# Patient Record
Sex: Female | Born: 1990 | Race: White | Hispanic: No | Marital: Married | Smoking: Former smoker
Health system: Southern US, Community
[De-identification: ages and names within clinical notes are randomized; demographics above are authoritative.]

## PROBLEM LIST (undated history)

## (undated) ENCOUNTER — Inpatient Hospital Stay (HOSPITAL_COMMUNITY): Payer: Self-pay

## (undated) DIAGNOSIS — Z3490 Encounter for supervision of normal pregnancy, unspecified, unspecified trimester: Secondary | ICD-10-CM

## (undated) DIAGNOSIS — K219 Gastro-esophageal reflux disease without esophagitis: Secondary | ICD-10-CM

## (undated) DIAGNOSIS — F909 Attention-deficit hyperactivity disorder, unspecified type: Secondary | ICD-10-CM

## (undated) DIAGNOSIS — M419 Scoliosis, unspecified: Secondary | ICD-10-CM

## (undated) HISTORY — DX: Attention-deficit hyperactivity disorder, unspecified type: F90.9

## (undated) HISTORY — DX: Scoliosis, unspecified: M41.9

## (undated) HISTORY — DX: Gastro-esophageal reflux disease without esophagitis: K21.9

## (undated) HISTORY — PX: MULTIPLE TOOTH EXTRACTIONS: SHX2053

---
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
## 2012-09-09 ENCOUNTER — Emergency Department (HOSPITAL_BASED_OUTPATIENT_CLINIC_OR_DEPARTMENT_OTHER)
Admission: EM | Admit: 2012-09-09 | Discharge: 2012-09-09 | Disposition: A | Discharge status: Home / Self Care | Payer: Medicaid - Out of State | Attending: Emergency Medicine | Admitting: Emergency Medicine

## 2012-09-09 ENCOUNTER — Encounter (HOSPITAL_BASED_OUTPATIENT_CLINIC_OR_DEPARTMENT_OTHER): Payer: Self-pay | Admitting: *Deleted

## 2012-09-09 DIAGNOSIS — R0982 Postnasal drip: Secondary | ICD-10-CM | POA: Insufficient documentation

## 2012-09-09 DIAGNOSIS — R05 Cough: Secondary | ICD-10-CM | POA: Insufficient documentation

## 2012-09-09 DIAGNOSIS — J069 Acute upper respiratory infection, unspecified: Secondary | ICD-10-CM | POA: Insufficient documentation

## 2012-09-09 DIAGNOSIS — R51 Headache: Secondary | ICD-10-CM | POA: Insufficient documentation

## 2012-09-09 DIAGNOSIS — R059 Cough, unspecified: Secondary | ICD-10-CM | POA: Insufficient documentation

## 2012-09-09 DIAGNOSIS — J3489 Other specified disorders of nose and nasal sinuses: Secondary | ICD-10-CM | POA: Insufficient documentation

## 2012-09-09 DIAGNOSIS — B9789 Other viral agents as the cause of diseases classified elsewhere: Secondary | ICD-10-CM | POA: Insufficient documentation

## 2012-09-09 DIAGNOSIS — B349 Viral infection, unspecified: Secondary | ICD-10-CM

## 2012-09-09 DIAGNOSIS — R52 Pain, unspecified: Secondary | ICD-10-CM | POA: Insufficient documentation

## 2012-09-09 NOTE — ED Notes (Signed)
Cough, sneezing, sore throat, headache and body aches. She is [redacted] weeks pregnant.

## 2012-09-09 NOTE — ED Provider Notes (Signed)
History     CSN: [id number]  Arrival date & time 09/09/12  1245   First MD Initiated Contact with Patient 09/09/12 1306      Chief Complaint  Patient presents with  . URI    (Consider location/radiation/quality/duration/timing/severity/associated sxs/prior treatment) HPI Comments: Patient who is currently [redacted] weeks pregnant presents today with a chief complaint sore throat, rhinorrhea, post nasal drip, nasal congestion, rhinorrhea, headache, dry cough, and body aches.  Her symptoms have been present since yesterday and are gradually worsening.  She has not taken anything for her symptoms.  She denies fever, chills, chest pain, neck pain/stiffness, or shortness of breath.  She also denies vaginal bleeding, vaginal discharge, pelvic pain, or abdominal pain.  She has also been nauseous, but denies vomiting.  No prior history of Asthma.    The history is provided by the patient.    History reviewed. No pertinent past medical history.  History reviewed. No pertinent past surgical history.  No family history on file.  History  Substance Use Topics  . Smoking status: Never Smoker   . Smokeless tobacco: Not on file  . Alcohol Use: No    OB History   Grav Para Term Preterm Abortions TAB SAB Ect Mult Living                  Review of Systems  Constitutional: Negative for fever and chills.  HENT: Positive for sore throat.   Gastrointestinal: Positive for nausea. Negative for vomiting and abdominal pain.  Genitourinary: Negative for vaginal bleeding, vaginal discharge and pelvic pain.    Allergies  Review of patient's allergies indicates no known allergies.  Home Medications  No current outpatient prescriptions on file.  BP 95/62  Pulse 101  Temp(Src) 98.7 F (37.1 C) (Oral)  Resp 20  Wt 145 lb (65.772 kg)  SpO2 98%  LMP 07/30/2012  Physical Exam  Nursing note and vitals reviewed. Constitutional: She appears well-developed and well-nourished. No distress.  HENT:   Head: Normocephalic and atraumatic.  Right Ear: Tympanic membrane and ear canal normal.  Left Ear: Tympanic membrane and ear canal normal.  Nose: Mucosal edema and rhinorrhea present. Right sinus exhibits no maxillary sinus tenderness and no frontal sinus tenderness. Left sinus exhibits no maxillary sinus tenderness and no frontal sinus tenderness.  Mouth/Throat: Uvula is midline, oropharynx is clear and moist and mucous membranes are normal. No oropharyngeal exudate or posterior oropharyngeal edema.  Eyes: EOM are normal. Pupils are equal, round, and reactive to light.  Neck: Normal range of motion. Neck supple.  Cardiovascular: Normal rate, regular rhythm and normal heart sounds.   Pulmonary/Chest: Effort normal and breath sounds normal.  Abdominal: Soft. There is no tenderness.  Neurological: She is alert.  Skin: Skin is warm and dry. She is not diaphoretic.  Psychiatric: She has a normal mood and affect.    ED Course  Procedures (including critical care time)  Labs Reviewed - No data to display No results found.   No diagnosis found.    MDM  Symptoms consistent with viral illness.  Patient is afebrile.  Vital signs stable.  Patient reported that she is currently [redacted] weeks pregnant.  Patient denies any abdominal or pelvic pain.  No vaginal discharge or bleeding.  Patient stable for discharge.  Instructed patient to take Tylenol for symptoms.  Return precautions given.        Pascal Lux [place], PA-C 09/09/12 [phone]

## 2012-09-09 NOTE — ED Notes (Signed)
Pt discharged with family member.  Verbalize understanding to follow up with OBGYN

## 2012-09-14 NOTE — ED Provider Notes (Signed)
History/physical exam/procedure(s) were performed by non-physician practitioner and as supervising physician I was immediately available for consultation/collaboration. I have reviewed all notes and am in agreement with care and plan.   Hilario Quarry, MD 09/14/12 1201

## 2012-09-17 ENCOUNTER — Emergency Department (HOSPITAL_COMMUNITY): Payer: Medicaid - Out of State

## 2012-09-17 ENCOUNTER — Emergency Department (HOSPITAL_COMMUNITY)
Admission: EM | Admit: 2012-09-17 | Discharge: 2012-09-18 | Disposition: A | Discharge status: Home / Self Care | Payer: Medicaid - Out of State | Attending: Emergency Medicine | Admitting: Emergency Medicine

## 2012-09-17 ENCOUNTER — Encounter (HOSPITAL_COMMUNITY): Payer: Self-pay | Admitting: Emergency Medicine

## 2012-09-17 DIAGNOSIS — R1031 Right lower quadrant pain: Secondary | ICD-10-CM | POA: Insufficient documentation

## 2012-09-17 DIAGNOSIS — O2390 Unspecified genitourinary tract infection in pregnancy, unspecified trimester: Secondary | ICD-10-CM | POA: Insufficient documentation

## 2012-09-17 DIAGNOSIS — N760 Acute vaginitis: Secondary | ICD-10-CM | POA: Insufficient documentation

## 2012-09-17 DIAGNOSIS — B9689 Other specified bacterial agents as the cause of diseases classified elsewhere: Secondary | ICD-10-CM

## 2012-09-17 DIAGNOSIS — R109 Unspecified abdominal pain: Secondary | ICD-10-CM

## 2012-09-17 DIAGNOSIS — O9989 Other specified diseases and conditions complicating pregnancy, childbirth and the puerperium: Secondary | ICD-10-CM | POA: Insufficient documentation

## 2012-09-17 LAB — URINALYSIS, ROUTINE W REFLEX MICROSCOPIC
Bilirubin Urine: NEGATIVE
Glucose, UA: NEGATIVE mg/dL
Hgb urine dipstick: NEGATIVE
Ketones, ur: NEGATIVE mg/dL
Protein, ur: NEGATIVE mg/dL
Urobilinogen, UA: 0.2 mg/dL (ref 0.0–1.0)

## 2012-09-17 LAB — WET PREP, GENITAL: WBC, Wet Prep HPF POC: NONE SEEN

## 2012-09-17 LAB — POCT PREGNANCY, URINE: Preg Test, Ur: POSITIVE — AB

## 2012-09-17 MED ORDER — ACETAMINOPHEN 325 MG PO TABS
650.0000 mg | ORAL_TABLET | Freq: Once | ORAL | Status: AC
  Administered 2012-09-17: 650 mg via ORAL
  Filled 2012-09-17: qty 2

## 2012-09-17 NOTE — ED Notes (Signed)
Pt states she is having cramping all over her body and sharp pain in her right lower quadrant  Pt states she is pregnant at least 4 weeks, unsure when her last period was  Pt states she had a small amt of spotting a few days ago but nothing since  Pt states her sxs started about an hour ago  Pt states she has had a cough

## 2012-09-17 NOTE — ED Provider Notes (Signed)
History     CSN: [id number]  Arrival date & time 09/17/12  2023   First MD Initiated Contact with Patient 09/17/12 2119      Chief Complaint  Patient presents with  . Abdominal Pain    (Consider location/radiation/quality/duration/timing/severity/associated sxs/prior treatment) HPI History provided by pt.   Pt took a positive pregnancy test one month ago.  LMP ~2 months ago.  Has not had any prenatal care.  2 hours prior to arrival she developed severe cramping in right lower abd.  Intermittent, non-radiating, no associated sx.  Had vaginal spotting 1 week ago, but none since.  G1P1; last pregnancy uncomplicated.  No h/o abdominal surgeries.  History reviewed. No pertinent past medical history.  History reviewed. No pertinent past surgical history.  Family History  Problem Relation Age of Onset  . Adopted: Yes    History  Substance Use Topics  . Smoking status: Never Smoker   . Smokeless tobacco: Not on file  . Alcohol Use: No    OB History   Grav Para Term Preterm Abortions TAB SAB Ect Mult Living   1               Review of Systems  All other systems reviewed and are negative.    Allergies  Review of patient's allergies indicates no known allergies.  Home Medications   Current Outpatient Rx  Name  Route  Sig  Dispense  Refill  . guaiFENesin (MUCINEX) 600 MG 12 hr tablet   Oral   Take 1,200 mg by mouth 2 (two) times daily.         Marland Kitchen ibuprofen (ADVIL,MOTRIN) 200 MG tablet   Oral   Take 400 mg by mouth every 6 (six) hours as needed for pain.         . Prenatal Vit-Fe Fumarate-FA (MULTIVITAMIN-PRENATAL) 27-0.8 MG TABS   Oral   Take 2 tablets by mouth daily at 12 noon.           BP 103/51  Pulse 92  Temp(Src) 98.1 F (36.7 C) (Oral)  Resp 18  Ht 5\' 5"  (1.651 m)  Wt 145 lb (65.772 kg)  BMI 24.13 kg/m2  SpO2 100%  LMP 07/30/2012  Physical Exam  Nursing note and vitals reviewed. Constitutional: She is oriented to person, place, and time.  She appears well-developed and well-nourished. No distress.  HENT:  Head: Normocephalic and atraumatic.  Eyes:  Normal appearance  Neck: Normal range of motion.  Cardiovascular: Normal rate and regular rhythm.   Pulmonary/Chest: Effort normal and breath sounds normal. No respiratory distress.  Abdominal: Soft. Bowel sounds are normal. She exhibits no distension and no mass. There is no rebound and no guarding.  Diffuse tenderness but worst in right suprapubic  Genitourinary:  R CVA tenderness. Nml external genitalia.  No vaginal discharge/bleeding.  Cervix closed and appears nml.  No adnexal or cervical motion tenderess.    Musculoskeletal: Normal range of motion.  Neurological: She is alert and oriented to person, place, and time.  Skin: Skin is warm and dry. No rash noted.  Psychiatric: She has a normal mood and affect. Her behavior is normal.    ED Course  Procedures (including critical care time)  Labs Reviewed  WET PREP, GENITAL - Abnormal; Notable for the following:    Clue Cells Wet Prep HPF POC MODERATE (*)    All other components within normal limits  URINALYSIS, ROUTINE W REFLEX MICROSCOPIC - Abnormal; Notable for the following:    APPearance  CLOUDY (*)    Specific Gravity, Urine 1.036 (*)    All other components within normal limits  POCT PREGNANCY, URINE - Abnormal; Notable for the following:    Preg Test, Ur POSITIVE (*)    All other components within normal limits  GC/CHLAMYDIA PROBE AMP   Mr Pelvis Wo Contrast  09/18/2012   *RADIOLOGY REPORT*  Clinical Data:  Right lower quadrant abdominal pain.  Early pregnancy.  MRI ABDOMEN AND PELVIS WITHOUT CONTRAST  Technique:  Multiplanar multisequence MR imaging of the abdomen and pelvis was performed.  No intravenous contrast was administered.  Comparison:  09/17/2012 ultrasound  MRI ABDOMEN  Findings:  On image 64 of series 4, there is a subtle hyperintensity in the hepatic parenchyma of segment 8. Probable central scar in  an underlying lesion based on morphology and appearance.  Visualized liver otherwise unremarkable.  Small subcutaneous lesion along the left posterolateral back on image 51 of series 4 is probably a sebaceous cyst or similar cutaneous lesion.  Noncontrast MR appearance of the spleen, pancreas, adrenal glands, and kidneys is normal.  No upper abdominal ascites.  Gallbladder mildly contracted but otherwise unremarkable.  No upper abdominal adenopathy observed.  IMPRESSION:  1.  Suspected subtle liver lesion in segment eight, possibly focal nodular hyperplasia. Hepatic adenoma is a possibility but seems less likely given the suggestion of a faint central scar.  No evidence of associated blood products.  I doubt that this lesion is currently of clinical significance or contributing to the patient's pain, but this may warrant followup with a dedicated hepatic protocol MRI with and without contrast in the postpartum period.  MRI PELVIS  Findings: The cecum appears to be in the pelvis.  Candidate structure for appendix medial to the cecum does not appear inflamed.  This is shown on images 15-20 of series 5.  There is only a trace amount of free pelvic fluid in the cul-de- sac. Thick-walled cystic lesion of the right ovary, as shown on recent ultrasound, probably a corpus luteum of pregnancy.  The psoas and rectus abdominus musculature unremarkable.  The sacroiliac joints normal.  No large right eccentric disc protrusion observed.  No adenopathy noted in the abdomen or pelvis. Gestational sac somewhat eccentric to the right in the uterus, but not believed to be cornual/interstitial based on apparent intact overlying myometrium.  No dilated bowel observed.  Urinary bladder not distended but unremarkable.  IMPRESSION:  1.  No compelling findings of acute appendicitis.  Candidate structure for appendix medial to the cecum does not appear inflamed.  2.  Right fundal gestational sac noted. Suspected right ovarian corpus luteum  of pregnancy.   Original Report Authenticated By: Gaylyn Rong, M.D.   Mr Abdomen Wo Contrast  09/18/2012   *RADIOLOGY REPORT*  Clinical Data:  Right lower quadrant abdominal pain.  Early pregnancy.  MRI ABDOMEN AND PELVIS WITHOUT CONTRAST  Technique:  Multiplanar multisequence MR imaging of the abdomen and pelvis was performed.  No intravenous contrast was administered.  Comparison:  09/17/2012 ultrasound  MRI ABDOMEN  Findings:  On image 64 of series 4, there is a subtle hyperintensity in the hepatic parenchyma of segment 8. Probable central scar in an underlying lesion based on morphology and appearance.  Visualized liver otherwise unremarkable.  Small subcutaneous lesion along the left posterolateral back on image 51 of series 4 is probably a sebaceous cyst or similar cutaneous lesion.  Noncontrast MR appearance of the spleen, pancreas, adrenal glands, and kidneys is normal.  No upper  abdominal ascites.  Gallbladder mildly contracted but otherwise unremarkable.  No upper abdominal adenopathy observed.  IMPRESSION:  1.  Suspected subtle liver lesion in segment eight, possibly focal nodular hyperplasia. Hepatic adenoma is a possibility but seems less likely given the suggestion of a faint central scar.  No evidence of associated blood products.  I doubt that this lesion is currently of clinical significance or contributing to the patient's pain, but this may warrant followup with a dedicated hepatic protocol MRI with and without contrast in the postpartum period.  MRI PELVIS  Findings: The cecum appears to be in the pelvis.  Candidate structure for appendix medial to the cecum does not appear inflamed.  This is shown on images 15-20 of series 5.  There is only a trace amount of free pelvic fluid in the cul-de- sac. Thick-walled cystic lesion of the right ovary, as shown on recent ultrasound, probably a corpus luteum of pregnancy.  The psoas and rectus abdominus musculature unremarkable.  The sacroiliac  joints normal.  No large right eccentric disc protrusion observed.  No adenopathy noted in the abdomen or pelvis. Gestational sac somewhat eccentric to the right in the uterus, but not believed to be cornual/interstitial based on apparent intact overlying myometrium.  No dilated bowel observed.  Urinary bladder not distended but unremarkable.  IMPRESSION:  1.  No compelling findings of acute appendicitis.  Candidate structure for appendix medial to the cecum does not appear inflamed.  2.  Right fundal gestational sac noted. Suspected right ovarian corpus luteum of pregnancy.   Original Report Authenticated By: Gaylyn Rong, M.D.   US Ob Comp Less 14 Wks  09/17/2012   *RADIOLOGY REPORT*  Clinical Data: Right lower quadrant pain, pregnant.  OBSTETRIC <14 WK [place] AND TRANSVAGINAL OB US  Technique:  Both transabdominal and transvaginal ultrasound examinations were performed for complete evaluation of the gestation as well as the maternal uterus, adnexal regions, and pelvic cul-de-sac.  Transvaginal technique was performed to assess early pregnancy.  Comparison:  None.  Intrauterine gestational sac:  Visualized/normal in shape. Yolk sac: Identified Embryo: Identified Cardiac Activity: Identified Heart Rate: 123 bpm  CRL: 7.7  mm  6 w  5 d        [place] EDC: 05/08/2013  Maternal uterus/adnexae: No subchorionic hemorrhage.  Normal sonographic appearance to the ovaries with a resolving corpus luteal cyst on the right.  Small amount of free fluid.  IMPRESSION: Single intrauterine gestation with cardiac activity documented. Estimated age of 6 weeks 5 days by crown-rump length.  Small amount of free fluid.   Original Report Authenticated By: Jearld Lesch, M.D.   US Ob Transvaginal  09/17/2012   *RADIOLOGY REPORT*  Clinical Data: Right lower quadrant pain, pregnant.  OBSTETRIC <14 WK [place] AND TRANSVAGINAL OB US  Technique:  Both transabdominal and transvaginal ultrasound examinations were performed for complete  evaluation of the gestation as well as the maternal uterus, adnexal regions, and pelvic cul-de-sac.  Transvaginal technique was performed to assess early pregnancy.  Comparison:  None.  Intrauterine gestational sac:  Visualized/normal in shape. Yolk sac: Identified Embryo: Identified Cardiac Activity: Identified Heart Rate: 123 bpm  CRL: 7.7  mm  6 w  5 d        [place] EDC: 05/08/2013  Maternal uterus/adnexae: No subchorionic hemorrhage.  Normal sonographic appearance to the ovaries with a resolving corpus luteal cyst on the right.  Small amount of free fluid.  IMPRESSION: Single intrauterine gestation with cardiac activity documented. Estimated age of  6 weeks 5 days by crown-rump length.  Small amount of free fluid.   Original Report Authenticated By: Jearld Lesch, M.D.     1. Abdominal pain   2. BV (bacterial vaginosis)       MDM  Healthy 21yo pregnant F presents w/ severe right lower abd pain since this evening.  Vaginal spotting 1 week ago but not since.  On exam, afebrile, NAD, abd soft/non-distended, diffusely ttp but worst in right suprapubic, unremarkable genitalia.  Labs sig for vaginal clue cells.  Transvaginal US to r/o ectopic pending.  Pt has received tylenol for pain.   11:24 PM   Transvaginal US shows IUP.  Results discussed w/ pt.  On -reexamination, pt most tender in RLQ.  Dr. Juleen China has examined and agrees that we have to r/o appendicitis.  Discussed w/ radiology and they recommend MRI.  Will obtain in am.  Laveda Norman, PA-C to dispo.            Otilio Miu, PA-C 09/18/12 1209

## 2012-09-17 NOTE — ED Notes (Signed)
PA and PA student at bedside speaking to pt at this time

## 2012-09-18 ENCOUNTER — Emergency Department (HOSPITAL_COMMUNITY): Payer: Medicaid - Out of State

## 2012-09-18 LAB — GC/CHLAMYDIA PROBE AMP
CT Probe RNA: NEGATIVE
GC Probe RNA: NEGATIVE

## 2012-09-18 MED ORDER — METRONIDAZOLE 500 MG PO TABS: 500.0000 mg | ORAL_TABLET | Freq: Two times a day (BID) | ORAL | Status: DC

## 2012-09-18 NOTE — Discharge Instructions (Signed)
Continue your prenatal vitamin.  Take nothing other than tylenol for pain.  Avoid alcohol and cigarette smoke as well as large amounts of caffeine.  Follow up with an Ob/Gyn as soon as possible.   Bacterial Vaginosis Bacterial vaginosis (BV) is a vaginal infection where the normal balance of bacteria in the vagina is disrupted. The normal balance is then replaced by an overgrowth of certain bacteria. There are several different kinds of bacteria that can cause BV. BV is the most common vaginal infection in women of childbearing age. CAUSES   The cause of BV is not fully understood. BV develops when there is an increase or imbalance of harmful bacteria.  Some activities or behaviors can upset the normal balance of bacteria in the vagina and put women at increased risk including:  Having a new sex partner or multiple sex partners.  Douching.  Using an intrauterine device (IUD) for contraception.  It is not clear what role sexual activity plays in the development of BV. However, women that have never had sexual intercourse are rarely infected with BV. Women do not get BV from toilet seats, bedding, swimming pools or from touching objects around them.  SYMPTOMS   Grey vaginal discharge.  A fish-like odor with discharge, especially after sexual intercourse.  Itching or burning of the vagina and vulva.  Burning or pain with urination.  Some women have no signs or symptoms at all. DIAGNOSIS  Your caregiver must examine the vagina for signs of BV. Your caregiver will perform lab tests and look at the sample of vaginal fluid through a microscope. They will look for bacteria and abnormal cells (clue cells), a pH test higher than 4.5, and a positive amine test all associated with BV.  RISKS AND COMPLICATIONS   Pelvic inflammatory disease (PID).  Infections following gynecology surgery.  Developing HIV.  Developing herpes virus. TREATMENT  Sometimes BV will clear up without treatment.  However, all women with symptoms of BV should be treated to avoid complications, especially if gynecology surgery is planned. Female partners generally do not need to be treated. However, BV may spread between female sex partners so treatment is helpful in preventing a recurrence of BV.   BV may be treated with antibiotics. The antibiotics come in either pill or vaginal cream forms. Either can be used with nonpregnant or pregnant women, but the recommended dosages differ. These antibiotics are not harmful to the baby.  BV can recur after treatment. If this happens, a second round of antibiotics will often be prescribed.  Treatment is important for pregnant women. If not treated, BV can cause a premature delivery, especially for a pregnant woman who had a premature birth in the past. All pregnant women who have symptoms of BV should be checked and treated.  For chronic reoccurrence of BV, treatment with a type of prescribed gel vaginally twice a week is helpful. HOME CARE INSTRUCTIONS   Finish all medication as directed by your caregiver.  Do not have sex until treatment is completed.  Tell your sexual partner that you have a vaginal infection. They should see their caregiver and be treated if they have problems, such as a mild rash or itching.  Practice safe sex. Use condoms. Only have 1 sex partner. PREVENTION  Basic prevention steps can help reduce the risk of upsetting the natural balance of bacteria in the vagina and developing BV:  Do not have sexual intercourse (be abstinent).  Do not douche.  Use all of the medicine prescribed  for treatment of BV, even if the signs and symptoms go away.  Tell your sex partner if you have BV. That way, they can be treated, if needed, to prevent reoccurrence. SEEK MEDICAL CARE IF:   Your symptoms are not improving after 3 days of treatment.  You have increased discharge, pain, or fever. MAKE SURE YOU:   Understand these instructions.  Will  watch your condition.  Will get help right away if you are not doing well or get worse. FOR MORE INFORMATION  Division of STD Prevention (DSTDP), Centers for Disease Control and Prevention: [employer] American Social Health Association (ASHA): www.ashastd.org  Document Released: 04/24/2005 Document Revised: 07/17/2011 Document Reviewed: 10/15/2008 [facility] Patient Information 2013 [place], [place].

## 2012-09-18 NOTE — ED Provider Notes (Signed)
Pt is pregnant.  Has RLQ.  Concern for appendicitis.  Will have abdominal MRI to r/o appendicitis.  Report received at end of shift.    If neg, refer to [facility] for further pregnancy care.   BP 92/51  Pulse 60  Temp(Src) 98.4 F (36.9 C) (Oral)  Resp 18  Ht 5\' 5"  (1.651 m)  Wt 145 lb (65.772 kg)  BMI 24.13 kg/m2  SpO2 99%  LMP 07/30/2012  On examination she appeared in no acute distress. Vital signs as documented. Skin warm and dry and without overt rashes. Neck without JVD. Lungs clear. Heart exam notable for regular rhythm, normal sounds and absence of murmurs, rubs or gallops. Abdomen with tenderness to right lower quadrant but without evidence of organomegaly, masses. Extremities nonedematous.  9:00 AM MRI of abdomen shows no acute changes concerning for appendicitis or other acute pathology.  Reassurance given to pt.  Pt does have moderate clue cell in wet prep.  Will treat for BV as it can lead to vaginitis if left untreated, which can complicate her pregnancy.  Pt recommend to f/u with OBGYN for further care of her pregnancy.  Resource given.    BP 92/51  Pulse 60  Temp(Src) 98.4 F (36.9 C) (Oral)  Resp 18  Ht 5\' 5"  (1.651 m)  Wt 145 lb (65.772 kg)  BMI 24.13 kg/m2  SpO2 99%  LMP 07/30/2012  I have reviewed nursing notes and vital signs. I personally reviewed the imaging tests through PACS system  I reviewed available ER/hospitalization records thought the EMR  Results for orders placed during the hospital encounter of 09/17/12  WET PREP, GENITAL      Result Value Range   Yeast Wet Prep HPF POC NONE SEEN  NONE SEEN   Trich, Wet Prep NONE SEEN  NONE SEEN   Clue Cells Wet Prep HPF POC MODERATE (*) NONE SEEN   WBC, Wet Prep HPF POC NONE SEEN  NONE SEEN  URINALYSIS, ROUTINE W REFLEX MICROSCOPIC      Result Value Range   Color, Urine YELLOW  YELLOW   APPearance CLOUDY (*) CLEAR   Specific Gravity, Urine 1.036 (*) 1.005 - 1.030   pH 5.5  5.0 - 8.0   Glucose, UA NEGATIVE   NEGATIVE mg/dL   Hgb urine dipstick NEGATIVE  NEGATIVE   Bilirubin Urine NEGATIVE  NEGATIVE   Ketones, ur NEGATIVE  NEGATIVE mg/dL   Protein, ur NEGATIVE  NEGATIVE mg/dL   Urobilinogen, UA 0.2  0.0 - 1.0 mg/dL   Nitrite NEGATIVE  NEGATIVE   Leukocytes, UA NEGATIVE  NEGATIVE  POCT PREGNANCY, URINE      Result Value Range   Preg Test, Ur POSITIVE (*) NEGATIVE   Mr Pelvis Wo Contrast  09/18/2012   *RADIOLOGY REPORT*  Clinical Data:  Right lower quadrant abdominal pain.  Early pregnancy.  MRI ABDOMEN AND PELVIS WITHOUT CONTRAST  Technique:  Multiplanar multisequence MR imaging of the abdomen and pelvis was performed.  No intravenous contrast was administered.  Comparison:  09/17/2012 ultrasound  MRI ABDOMEN  Findings:  On image 64 of series 4, there is a subtle hyperintensity in the hepatic parenchyma of segment 8. Probable central scar in an underlying lesion based on morphology and appearance.  Visualized liver otherwise unremarkable.  Small subcutaneous lesion along the left posterolateral back on image 51 of series 4 is probably a sebaceous cyst or similar cutaneous lesion.  Noncontrast MR appearance of the spleen, pancreas, adrenal glands, and kidneys is normal.  No  upper abdominal ascites.  Gallbladder mildly contracted but otherwise unremarkable.  No upper abdominal adenopathy observed.  IMPRESSION:  1.  Suspected subtle liver lesion in segment eight, possibly focal nodular hyperplasia. Hepatic adenoma is a possibility but seems less likely given the suggestion of a faint central scar.  No evidence of associated blood products.  I doubt that this lesion is currently of clinical significance or contributing to the patient's pain, but this may warrant followup with a dedicated hepatic protocol MRI with and without contrast in the postpartum period.  MRI PELVIS  Findings: The cecum appears to be in the pelvis.  Candidate structure for appendix medial to the cecum does not appear inflamed.  This is shown  on images 15-20 of series 5.  There is only a trace amount of free pelvic fluid in the cul-de- sac. Thick-walled cystic lesion of the right ovary, as shown on recent ultrasound, probably a corpus luteum of pregnancy.  The psoas and rectus abdominus musculature unremarkable.  The sacroiliac joints normal.  No large right eccentric disc protrusion observed.  No adenopathy noted in the abdomen or pelvis. Gestational sac somewhat eccentric to the right in the uterus, but not believed to be cornual/interstitial based on apparent intact overlying myometrium.  No dilated bowel observed.  Urinary bladder not distended but unremarkable.  IMPRESSION:  1.  No compelling findings of acute appendicitis.  Candidate structure for appendix medial to the cecum does not appear inflamed.  2.  Right fundal gestational sac noted. Suspected right ovarian corpus luteum of pregnancy.   Original Report Authenticated By: Gaylyn Rong, M.D.   Mr Abdomen Wo Contrast  09/18/2012   *RADIOLOGY REPORT*  Clinical Data:  Right lower quadrant abdominal pain.  Early pregnancy.  MRI ABDOMEN AND PELVIS WITHOUT CONTRAST  Technique:  Multiplanar multisequence MR imaging of the abdomen and pelvis was performed.  No intravenous contrast was administered.  Comparison:  09/17/2012 ultrasound  MRI ABDOMEN  Findings:  On image 64 of series 4, there is a subtle hyperintensity in the hepatic parenchyma of segment 8. Probable central scar in an underlying lesion based on morphology and appearance.  Visualized liver otherwise unremarkable.  Small subcutaneous lesion along the left posterolateral back on image 51 of series 4 is probably a sebaceous cyst or similar cutaneous lesion.  Noncontrast MR appearance of the spleen, pancreas, adrenal glands, and kidneys is normal.  No upper abdominal ascites.  Gallbladder mildly contracted but otherwise unremarkable.  No upper abdominal adenopathy observed.  IMPRESSION:  1.  Suspected subtle liver lesion in segment  eight, possibly focal nodular hyperplasia. Hepatic adenoma is a possibility but seems less likely given the suggestion of a faint central scar.  No evidence of associated blood products.  I doubt that this lesion is currently of clinical significance or contributing to the patient's pain, but this may warrant followup with a dedicated hepatic protocol MRI with and without contrast in the postpartum period.  MRI PELVIS  Findings: The cecum appears to be in the pelvis.  Candidate structure for appendix medial to the cecum does not appear inflamed.  This is shown on images 15-20 of series 5.  There is only a trace amount of free pelvic fluid in the cul-de- sac. Thick-walled cystic lesion of the right ovary, as shown on recent ultrasound, probably a corpus luteum of pregnancy.  The psoas and rectus abdominus musculature unremarkable.  The sacroiliac joints normal.  No large right eccentric disc protrusion observed.  No adenopathy noted in the  abdomen or pelvis. Gestational sac somewhat eccentric to the right in the uterus, but not believed to be cornual/interstitial based on apparent intact overlying myometrium.  No dilated bowel observed.  Urinary bladder not distended but unremarkable.  IMPRESSION:  1.  No compelling findings of acute appendicitis.  Candidate structure for appendix medial to the cecum does not appear inflamed.  2.  Right fundal gestational sac noted. Suspected right ovarian corpus luteum of pregnancy.   Original Report Authenticated By: Gaylyn Rong, M.D.   US Ob Comp Less 14 Wks  09/17/2012   *RADIOLOGY REPORT*  Clinical Data: Right lower quadrant pain, pregnant.  OBSTETRIC <14 WK [place] AND TRANSVAGINAL OB US  Technique:  Both transabdominal and transvaginal ultrasound examinations were performed for complete evaluation of the gestation as well as the maternal uterus, adnexal regions, and pelvic cul-de-sac.  Transvaginal technique was performed to assess early pregnancy.  Comparison:  None.   Intrauterine gestational sac:  Visualized/normal in shape. Yolk sac: Identified Embryo: Identified Cardiac Activity: Identified Heart Rate: 123 bpm  CRL: 7.7  mm  6 w  5 d        [place] EDC: 05/08/2013  Maternal uterus/adnexae: No subchorionic hemorrhage.  Normal sonographic appearance to the ovaries with a resolving corpus luteal cyst on the right.  Small amount of free fluid.  IMPRESSION: Single intrauterine gestation with cardiac activity documented. Estimated age of 6 weeks 5 days by crown-rump length.  Small amount of free fluid.   Original Report Authenticated By: Jearld Lesch, M.D.   US Ob Transvaginal  09/17/2012   *RADIOLOGY REPORT*  Clinical Data: Right lower quadrant pain, pregnant.  OBSTETRIC <14 WK [place] AND TRANSVAGINAL OB US  Technique:  Both transabdominal and transvaginal ultrasound examinations were performed for complete evaluation of the gestation as well as the maternal uterus, adnexal regions, and pelvic cul-de-sac.  Transvaginal technique was performed to assess early pregnancy.  Comparison:  None.  Intrauterine gestational sac:  Visualized/normal in shape. Yolk sac: Identified Embryo: Identified Cardiac Activity: Identified Heart Rate: 123 bpm  CRL: 7.7  mm  6 w  5 d        [place] EDC: 05/08/2013  Maternal uterus/adnexae: No subchorionic hemorrhage.  Normal sonographic appearance to the ovaries with a resolving corpus luteal cyst on the right.  Small amount of free fluid.  IMPRESSION: Single intrauterine gestation with cardiac activity documented. Estimated age of 6 weeks 5 days by crown-rump length.  Small amount of free fluid.   Original Report Authenticated By: Jearld Lesch, M.D.      Fayrene Helper, PA-C 09/18/12 [phone]

## 2012-09-18 NOTE — Progress Notes (Signed)
ED CM saw patient regaridng PCP.  Patient stated that she does not have a PCP but will look into seeing her mother's PCP Dr. Senaida Ores in [place] who actually is an OBGYN. Encouraged patient to make an appointment with this physician.  Instructed patient on how to obtain an in network PCP.  Patient verbalized understanding.

## 2012-09-18 NOTE — ED Notes (Signed)
MRI called asking if pt needed any special considerations for test.

## 2012-09-18 NOTE — ED Provider Notes (Signed)
Medical screening examination/treatment/procedure(s) were conducted as a shared visit with non-physician practitioner(s) and myself.  I personally evaluated the patient during the encounter.  21yF with RLQ pain. Tenderness w/voluntary guarding on exam. Possibly ovarian cyst noted on [place]. Cannot exclude appendicitis though. Pregnant precluding CT. MRI not available at this hour. Will continue serial exams in ED until can obtain MRI in am.   Raeford Razor, MD 09/18/12 [phone]

## 2012-09-19 NOTE — Care Management ED Note (Signed)
       CARE MANAGEMENT ED NOTE 09/18/2012  Patient:  Brandi Hoffman, Brandi Hoffman   Account Number:  [id number]  Date Initiated:  09/18/2012  Documentation initiated by:  Radford Pax  Subjective/Objective Assessment:   PCP inquiry     Subjective/Objective Assessment Detail:     Action/Plan:   Action/Plan Detail:   Anticipated DC Date:  09/18/2012     Status Recommendation to Physician:   Result of Recommendation:    Other ED Services  Consult Working Plan    DC Planning Services  Other  PCP issues    Choice offered to / List presented to:            Status of service:  Completed, signed off  ED Comments:   ED Comments Detail:  ED CM saw patient regaridng PCP.  Patient stated that she does not have a PCP but will look into seeing her mother's PCP Dr. Senaida Ores in [place] who actually is an OBGYN. Encouraged patient to make an appointment with this physician.  Instructed patient on how to obtain an in network PCP.  Patient verbalized understanding.

## 2012-09-19 NOTE — ED Provider Notes (Signed)
Medical screening examination/treatment/procedure(s) were performed by non-physician practitioner and as supervising physician I was immediately available for consultation/collaboration.  Raeford Razor, MD 09/19/12 [phone]

## 2012-09-19 NOTE — ED Provider Notes (Signed)
Medical screening examination/treatment/procedure(s) were performed by non-physician practitioner and as supervising physician I was immediately available for consultation/collaboration.  Sunnie Nielsen, MD 09/19/12 [phone]

## 2012-09-30 ENCOUNTER — Emergency Department (HOSPITAL_COMMUNITY)
Admission: EM | Admit: 2012-09-30 | Discharge: 2012-09-30 | Disposition: A | Discharge status: Home / Self Care | Payer: Medicaid - Out of State | Attending: Emergency Medicine | Admitting: Emergency Medicine

## 2012-09-30 ENCOUNTER — Encounter (HOSPITAL_COMMUNITY): Payer: Self-pay | Admitting: *Deleted

## 2012-09-30 DIAGNOSIS — R350 Frequency of micturition: Secondary | ICD-10-CM | POA: Insufficient documentation

## 2012-09-30 DIAGNOSIS — N760 Acute vaginitis: Secondary | ICD-10-CM | POA: Insufficient documentation

## 2012-09-30 DIAGNOSIS — B9689 Other specified bacterial agents as the cause of diseases classified elsewhere: Secondary | ICD-10-CM

## 2012-09-30 DIAGNOSIS — H00019 Hordeolum externum unspecified eye, unspecified eyelid: Secondary | ICD-10-CM | POA: Insufficient documentation

## 2012-09-30 DIAGNOSIS — R300 Dysuria: Secondary | ICD-10-CM | POA: Insufficient documentation

## 2012-09-30 DIAGNOSIS — N898 Other specified noninflammatory disorders of vagina: Secondary | ICD-10-CM | POA: Insufficient documentation

## 2012-09-30 DIAGNOSIS — R12 Heartburn: Secondary | ICD-10-CM | POA: Insufficient documentation

## 2012-09-30 DIAGNOSIS — Z87891 Personal history of nicotine dependence: Secondary | ICD-10-CM | POA: Insufficient documentation

## 2012-09-30 DIAGNOSIS — H00016 Hordeolum externum left eye, unspecified eyelid: Secondary | ICD-10-CM

## 2012-09-30 DIAGNOSIS — O2390 Unspecified genitourinary tract infection in pregnancy, unspecified trimester: Secondary | ICD-10-CM | POA: Insufficient documentation

## 2012-09-30 LAB — URINALYSIS, ROUTINE W REFLEX MICROSCOPIC
Glucose, UA: NEGATIVE mg/dL
Ketones, ur: NEGATIVE mg/dL
Leukocytes, UA: NEGATIVE
Nitrite: NEGATIVE
Protein, ur: NEGATIVE mg/dL
Urobilinogen, UA: 0.2 mg/dL (ref 0.0–1.0)

## 2012-09-30 LAB — WET PREP, GENITAL
Trich, Wet Prep: NONE SEEN
Yeast Wet Prep HPF POC: NONE SEEN

## 2012-09-30 LAB — PREGNANCY, URINE: Preg Test, Ur: POSITIVE — AB

## 2012-09-30 MED ORDER — METRONIDAZOLE 50 MG/ML ORAL SUSPENSION: 500.0000 mg | Freq: Two times a day (BID) | ORAL | Status: DC

## 2012-09-30 MED ORDER — ERYTHROMYCIN 5 MG/GM OP OINT: TOPICAL_OINTMENT | OPHTHALMIC | Status: DC

## 2012-09-30 MED ORDER — ONDANSETRON HCL 4 MG PO TABS: 4.0000 mg | ORAL_TABLET | Freq: Three times a day (TID) | ORAL | Status: DC | PRN

## 2012-09-30 NOTE — ED Notes (Signed)
Pt c/o polyuria which began shortly after she started on abx for vaginal infection. Pt also presents with swelling and pain to left eye x2 weeks. Pt denies decreased/blurred/double vision in left eye.

## 2012-09-30 NOTE — ED Notes (Signed)
Patient has been on antibx for vaginal infection and has now developed urinary frequency.  She is 8 wks. Pregnant.  No c/o dysuria or cloudy urine.  Also has swelling at lateral corner of L eye.  Having increased acid reflux.

## 2012-09-30 NOTE — ED Provider Notes (Signed)
History     CSN: [id number]  Arrival date & time 09/30/12  1154   First MD Initiated Contact with Patient 09/30/12 1405      Chief Complaint  Patient presents with  . Urinary Frequency  . Eye Pain  . Heartburn     HPI Pt was seen at 1425.  Per pt, c/o gradual onset and persistence of constant dysuria that began 2 weeks ago. Has been associated with vaginal discharge.  Pt states she was eval in the ED for same, dx with "a vaginal infection," and rx flagyl. States she has not been taking the flagyl as rx because the pills "make me gag and vomit." Pt with hx G2P1, LMP approx "the end of March," with EGA approx 8 weeks. Pt has not seen an OB/GYN yet for this pregnancy.  States she does have an appt with OB/GYN Dr. Senaida Ores "the first week of June." Denies vaginal bleeding, no abd/pelvic pain, no back/flank pain, no N/V/D, no fevers, no rash.  Pt also c/o gradual onset and persistence of constant "swelling" in the corner of her upper left eyelid.  Pt denies pain, no visual changes, no pruritis, no open wounds, no injury.    History reviewed. No pertinent past medical history.  History reviewed. No pertinent past surgical history.  Family History  Problem Relation Age of Onset  . Adopted: Yes    History  Substance Use Topics  . Smoking status: Former Smoker    Types: Cigarettes  . Smokeless tobacco: Not on file  . Alcohol Use: No    OB History   Grav Para Term Preterm Abortions TAB SAB Ect Mult Living   1         1      Review of Systems ROS: Statement: All systems negative except as marked or noted in the HPI; Constitutional: Negative for fever and chills. ; ; Eyes: Negative for eye pain, redness and discharge. +left upper eyelid localized swelling. ; ; ENMT: Negative for ear pain, hoarseness, nasal congestion, sinus pressure and sore throat. ; ; Cardiovascular: Negative for chest pain, palpitations, diaphoresis, dyspnea and peripheral edema. ; ; Respiratory: Negative for  cough, wheezing and stridor. ; ; Gastrointestinal: Negative for nausea, vomiting, diarrhea, abdominal pain, blood in stool, hematemesis, jaundice and rectal bleeding. . ; ; Genitourinary: +dysuria. Negative for flank pain and hematuria. ; ; GYN:  No vaginal bleeding, +vaginal discharge, no vulvar pain.;; Musculoskeletal: Negative for back pain and neck pain. Negative for swelling and trauma.; ; Skin: Negative for pruritus, rash, abrasions, blisters, bruising and skin lesion.; ; Neuro: Negative for headache, lightheadedness and neck stiffness. Negative for weakness, altered level of consciousness , altered mental status, extremity weakness, paresthesias, involuntary movement, seizure and syncope.       Allergies  Review of patient's allergies indicates no known allergies.  Home Medications   Current Outpatient Rx  Name  Route  Sig  Dispense  Refill  . metroNIDAZOLE (FLAGYL) 500 MG tablet   Oral   Take 1 tablet (500 mg total) by mouth 2 (two) times daily.   14 tablet   0   . Prenatal Vit-Fe Fumarate-FA (MULTIVITAMIN-PRENATAL) 27-0.8 MG TABS   Oral   Take 2 tablets by mouth daily at 12 noon.           BP 91/65  Pulse 116  Temp(Src) 98.8 F (37.1 C) (Oral)  Resp 20  Ht 5\' 5"  (1.651 m)  Wt 139 lb (63.05 kg)  BMI 23.13 kg/m2  SpO2 98%  LMP 07/30/2012  Physical Exam 1430: Physical examination:  Nursing notes reviewed; Vital signs and O2 SAT reviewed;  Constitutional: Well developed, Well nourished, Well hydrated, In no acute distress; Head:  Normocephalic, atraumatic; Eyes: EOMI intact bilat and without pain. PERRL, No scleral icterus. +left upper outer lateral eyelid with localized stye. No drainage. No conjunctival injection. No hyphema; ENMT: Mouth and pharynx normal, Mucous membranes moist; Neck: Supple, Full range of motion, No lymphadenopathy; Cardiovascular: Regular rate and rhythm, No murmur, rub, or gallop; Respiratory: Breath sounds clear & equal bilaterally, No rales,  rhonchi, wheezes.  Speaking full sentences with ease, Normal respiratory effort/excursion; Chest: Nontender, Movement normal; Abdomen: Soft, Nontender, Nondistended, Normal bowel sounds; Genitourinary: No CVA tenderness.; Pelvic exam performed with permission of pt and female ED tech assist during exam.  External genitalia w/o lesions. Vaginal vault with thin clear discharge.  Cervix w/o lesions, not friable, GC/chlam and wet prep obtained and sent to lab.  Bimanual exam w/o CMT, uterine or adnexal tenderness.;; Extremities: Pulses normal, No tenderness, No edema, No calf edema or asymmetry.; Neuro: AA&Ox3, Major CN grossly intact.  Speech clear. No gross focal motor or sensory deficits in extremities.; Skin: Color normal, Warm, Dry.   ED Course  Procedures     MDM  MDM Reviewed: previous chart, nursing note and vitals Interpretation: labs   Results for orders placed during the hospital encounter of 09/30/12  WET PREP, GENITAL      Result Value Range   Yeast Wet Prep HPF POC NONE SEEN  NONE SEEN   Trich, Wet Prep NONE SEEN  NONE SEEN   Clue Cells Wet Prep HPF POC FEW (*) NONE SEEN   WBC, Wet Prep HPF POC FEW (*) NONE SEEN  URINALYSIS, ROUTINE W REFLEX MICROSCOPIC      Result Value Range   Color, Urine YELLOW  YELLOW   APPearance CLEAR  CLEAR   Specific Gravity, Urine 1.020  1.005 - 1.030   pH 7.5  5.0 - 8.0   Glucose, UA NEGATIVE  NEGATIVE mg/dL   Hgb urine dipstick NEGATIVE  NEGATIVE   Bilirubin Urine NEGATIVE  NEGATIVE   Ketones, ur NEGATIVE  NEGATIVE mg/dL   Protein, ur NEGATIVE  NEGATIVE mg/dL   Urobilinogen, UA 0.2  0.0 - 1.0 mg/dL   Nitrite NEGATIVE  NEGATIVE   Leukocytes, UA NEGATIVE  NEGATIVE  PREGNANCY, URINE      Result Value Range   Preg Test, Ur POSITIVE (*) NEGATIVE    1650:  Will tx for BV with liquid flagyl, as pt has not been taking the flagyl pills due to "gagging on them."  Tx stye symptomatically with warm compresses, local abx. No UTI on Udip; UC pending.  Dx and testing d/w pt and family.  Questions answered.  Verb understanding, agreeable to d/c home with outpt f/u.    Laray Anger, DO 10/02/12 1401

## 2012-10-01 LAB — URINE CULTURE: Colony Count: NO GROWTH

## 2012-10-24 ENCOUNTER — Emergency Department (HOSPITAL_COMMUNITY)
Admission: EM | Admit: 2012-10-24 | Discharge: 2012-10-24 | Disposition: A | Discharge status: Home / Self Care | Payer: Medicaid - Out of State | Attending: Emergency Medicine | Admitting: Emergency Medicine

## 2012-10-24 ENCOUNTER — Encounter (HOSPITAL_COMMUNITY): Payer: Self-pay | Admitting: Emergency Medicine

## 2012-10-24 DIAGNOSIS — R059 Cough, unspecified: Secondary | ICD-10-CM | POA: Insufficient documentation

## 2012-10-24 DIAGNOSIS — R05 Cough: Secondary | ICD-10-CM | POA: Insufficient documentation

## 2012-10-24 DIAGNOSIS — J3489 Other specified disorders of nose and nasal sinuses: Secondary | ICD-10-CM | POA: Insufficient documentation

## 2012-10-24 DIAGNOSIS — R070 Pain in throat: Secondary | ICD-10-CM | POA: Insufficient documentation

## 2012-10-24 DIAGNOSIS — H00019 Hordeolum externum unspecified eye, unspecified eyelid: Secondary | ICD-10-CM | POA: Insufficient documentation

## 2012-10-24 DIAGNOSIS — H00013 Hordeolum externum right eye, unspecified eyelid: Secondary | ICD-10-CM

## 2012-10-24 DIAGNOSIS — Z87891 Personal history of nicotine dependence: Secondary | ICD-10-CM | POA: Insufficient documentation

## 2012-10-24 DIAGNOSIS — J069 Acute upper respiratory infection, unspecified: Secondary | ICD-10-CM | POA: Insufficient documentation

## 2012-10-24 DIAGNOSIS — Z79899 Other long term (current) drug therapy: Secondary | ICD-10-CM | POA: Insufficient documentation

## 2012-10-24 DIAGNOSIS — R1310 Dysphagia, unspecified: Secondary | ICD-10-CM | POA: Insufficient documentation

## 2012-10-24 DIAGNOSIS — O9989 Other specified diseases and conditions complicating pregnancy, childbirth and the puerperium: Secondary | ICD-10-CM | POA: Insufficient documentation

## 2012-10-24 DIAGNOSIS — J029 Acute pharyngitis, unspecified: Secondary | ICD-10-CM | POA: Insufficient documentation

## 2012-10-24 DIAGNOSIS — R0982 Postnasal drip: Secondary | ICD-10-CM | POA: Insufficient documentation

## 2012-10-24 NOTE — ED Provider Notes (Signed)
History     CSN: [id number]  Arrival date & time 10/24/12  1225   First MD Initiated Contact with Patient 10/24/12 1235      Chief Complaint  Patient presents with  . Cough  . Sore Throat  . Eye Problem    (Consider location/radiation/quality/duration/timing/severity/associated sxs/prior treatment) HPI Comments: Brandi Hoffman is a 22 y.o. Female presenting with a moderate sore throat which is worse with swallowing,  Nasal congestion with yellow drainage,  Post nasal drip and a recurrence of a right eye stye since yesterday evening.  She denies fevers, chills, myalgias, has had no difficulty swallowing, denies mouth or throat swelling and has had no shortness of breath, no nausea and denies headache.  She has used otc cough drops and drank tea with honey for her throat pain.  She denies any known exposures to strep throat.  She is currently [redacted] weeks pregnant, her obgyn is Dr. Senaida Ores in [place].  She denies abdominal or vaginal complaints.   The history is provided by the patient.    History reviewed. No pertinent past medical history.  History reviewed. No pertinent past surgical history.  Family History  Problem Relation Age of Onset  . Adopted: Yes    History  Substance Use Topics  . Smoking status: Former Smoker    Types: Cigarettes  . Smokeless tobacco: Not on file  . Alcohol Use: No    OB History   Grav Para Term Preterm Abortions TAB SAB Ect Mult Living   1         1      Review of Systems  Constitutional: Negative for fever.  HENT: Positive for congestion, sore throat and rhinorrhea. Negative for trouble swallowing, neck pain, neck stiffness and voice change.   Eyes: Negative.   Respiratory: Positive for cough. Negative for chest tightness and shortness of breath.   Cardiovascular: Negative for chest pain.  Gastrointestinal: Negative for nausea and abdominal pain.  Genitourinary: Negative.   Musculoskeletal: Negative for joint swelling and arthralgias.   Skin: Negative.  Negative for rash and wound.  Neurological: Negative for dizziness, weakness, light-headedness, numbness and headaches.  Psychiatric/Behavioral: Negative.     Allergies  Review of patient's allergies indicates no known allergies.  Home Medications   Current Outpatient Rx  Name  Route  Sig  Dispense  Refill  . calcium carbonate (TUMS - DOSED IN MG ELEMENTAL CALCIUM) 500 MG chewable tablet   Oral   Chew 1 tablet by mouth daily.         . cetirizine (ZYRTEC) 10 MG tablet   Oral   Take 10 mg by mouth daily.         . Prenatal Vit-Fe Fumarate-FA (MULTIVITAMIN-PRENATAL) 27-0.8 MG TABS   Oral   Take 2 tablets by mouth daily at 12 noon.           BP 97/62  Pulse 102  Temp(Src) 97.7 F (36.5 C)  Resp 18  Ht 5\' 5"  (1.651 m)  Wt 140 lb (63.504 kg)  BMI 23.3 kg/m2  SpO2 98%  LMP 07/30/2012  Physical Exam  Constitutional: She is oriented to person, place, and time. She appears well-developed and well-nourished.  HENT:  Head: Normocephalic and atraumatic.  Right Ear: Tympanic membrane and ear canal normal.  Left Ear: Tympanic membrane and ear canal normal.  Nose: Mucosal edema and rhinorrhea present.  Mouth/Throat: Uvula is midline and mucous membranes are normal. Posterior oropharyngeal erythema present. No oropharyngeal exudate, posterior oropharyngeal edema or  tonsillar abscesses.  Eyes: Conjunctivae are normal.  Cardiovascular: Normal rate and normal heart sounds.   Pulmonary/Chest: Effort normal and breath sounds normal. No respiratory distress. She has no wheezes. She has no rales.  Abdominal: Soft. There is no tenderness.  Musculoskeletal: Normal range of motion.  Neurological: She is alert and oriented to person, place, and time.  Skin: Skin is warm and dry. No rash noted.  Psychiatric: She has a normal mood and affect.    ED Course  Procedures (including critical care time)  Labs Reviewed  RAPID STREP SCREEN  CULTURE, GROUP A STREP    No results found.   1. Acute URI       MDM  Patients labs and/or radiological studies were viewed and considered during the medical decision making and disposition process. Pt's sx most consistent with viral uri.  Encouraged rest,  Fluids,  Tylenol , cough lozenges, given list of safe otc meds during pregnancy.  Encouraged f/u with obgyn or return here for any worsened sx.    The patient appears reasonably screened and/or stabilized for discharge and I doubt any other medical condition or other [facility] requiring further screening, evaluation, or treatment in the ED at this time prior to discharge.         Burgess Amor, PA-C 10/24/12 1501

## 2012-10-24 NOTE — ED Notes (Signed)
Pt c/o sore throat/cough/right eye irritation since last night.

## 2012-10-24 NOTE — ED Provider Notes (Signed)
Medical screening examination/treatment/procedure(s) were performed by non-physician practitioner and as supervising physician I was immediately available for consultation/collaboration.   Jaramie Bastos, MD 10/24/12 1518 

## 2012-10-26 LAB — CULTURE, GROUP A STREP

## 2012-11-13 LAB — OB RESULTS CONSOLE RPR: RPR: NONREACTIVE

## 2012-11-13 LAB — OB RESULTS CONSOLE RUBELLA ANTIBODY, IGM: Rubella: IMMUNE

## 2012-11-13 LAB — OB RESULTS CONSOLE HIV ANTIBODY (ROUTINE TESTING): HIV: NONREACTIVE

## 2012-11-13 LAB — OB RESULTS CONSOLE GC/CHLAMYDIA: Chlamydia: NEGATIVE

## 2013-02-07 ENCOUNTER — Encounter (HOSPITAL_COMMUNITY): Payer: Self-pay | Admitting: *Deleted

## 2013-02-07 ENCOUNTER — Inpatient Hospital Stay (HOSPITAL_COMMUNITY)
Admission: AD | Admit: 2013-02-07 | Discharge: 2013-02-07 | Disposition: A | Discharge status: Home / Self Care | Payer: Medicaid Other | Source: Ambulatory Visit | Attending: Obstetrics and Gynecology | Admitting: Obstetrics and Gynecology

## 2013-02-07 DIAGNOSIS — N949 Unspecified condition associated with female genital organs and menstrual cycle: Secondary | ICD-10-CM | POA: Insufficient documentation

## 2013-02-07 DIAGNOSIS — O99891 Other specified diseases and conditions complicating pregnancy: Secondary | ICD-10-CM | POA: Insufficient documentation

## 2013-02-07 DIAGNOSIS — R109 Unspecified abdominal pain: Secondary | ICD-10-CM | POA: Insufficient documentation

## 2013-02-07 DIAGNOSIS — E860 Dehydration: Secondary | ICD-10-CM | POA: Insufficient documentation

## 2013-02-07 DIAGNOSIS — O26899 Other specified pregnancy related conditions, unspecified trimester: Secondary | ICD-10-CM

## 2013-02-07 LAB — WET PREP, GENITAL
Clue Cells Wet Prep HPF POC: NONE SEEN
Trich, Wet Prep: NONE SEEN

## 2013-02-07 LAB — URINALYSIS, ROUTINE W REFLEX MICROSCOPIC
Bilirubin Urine: NEGATIVE
Ketones, ur: NEGATIVE mg/dL
Leukocytes, UA: NEGATIVE
Nitrite: NEGATIVE
Urobilinogen, UA: 0.2 mg/dL (ref 0.0–1.0)
pH: 6 (ref 5.0–8.0)

## 2013-02-07 MED ORDER — OXYCODONE-ACETAMINOPHEN 5-325 MG PO TABS
1.0000 | ORAL_TABLET | Freq: Once | ORAL | Status: AC
  Administered 2013-02-07: 1 via ORAL
  Filled 2013-02-07: qty 1

## 2013-02-07 MED ORDER — PROMETHAZINE HCL 25 MG PO TABS
25.0000 mg | ORAL_TABLET | Freq: Once | ORAL | Status: AC
  Administered 2013-02-07: 25 mg via ORAL
  Filled 2013-02-07: qty 1

## 2013-02-07 NOTE — MAU Note (Signed)
Pt reports left lower abd pain, lower abd cramping, pressure. Denies bleeding or discharge.

## 2013-02-07 NOTE — MAU Note (Signed)
PT SAYS HER LOWER ABD STARTED HURTING   AT 1030PM-  SHE WAS  IN THE CAR-    WAS SEEN LAST IN OFFICE  SEPT,  NEXT APPOINTMENT IS  Monday.     DENIES HSV AND MRSA.    LAST SEX-    2 WEEKS AGO.

## 2013-02-07 NOTE — MAU Provider Note (Signed)
History     CSN: [id number]  Arrival date and time: 02/07/13 8295   None     Chief Complaint  Patient presents with  . Abdominal Pain   HPI Comments: Brandi Hoffman 21 y.o.G2P1001 presents to MAU for low pelvic discomfort starting today. She is 27 weeks and 1 day pregnant. She can not account any reasons for discomforts. No meds taken. Denies any leaking of fluids, bleeding or contractions. She has not been drinking as much as normal due to cold symptoms. Complains of mild vaginal discharge with odor.     Abdominal Pain      History reviewed. No pertinent past medical history.  Past Surgical History  Procedure Laterality Date  . Multiple tooth extractions      Family History  Problem Relation Age of Onset  . Adopted: Yes    History  Substance Use Topics  . Smoking status: Former Smoker    Types: Cigarettes  . Smokeless tobacco: Not on file  . Alcohol Use: No    Allergies: No Known Allergies  Prescriptions prior to admission  Medication Sig Dispense Refill  . calcium carbonate (TUMS - DOSED IN MG ELEMENTAL CALCIUM) 500 MG chewable tablet Chew 1 tablet by mouth daily.      . cetirizine (ZYRTEC) 10 MG tablet Take 10 mg by mouth daily.      . Prenatal Vit-Fe Fumarate-FA (MULTIVITAMIN-PRENATAL) 27-0.8 MG TABS Take 2 tablets by mouth daily at 12 noon.      . ranitidine (ZANTAC) 150 MG tablet Take 150 mg by mouth 2 (two) times daily.        Review of Systems  Constitutional: Negative.   HENT: Negative.   Eyes: Negative.   Respiratory: Negative.   Cardiovascular: Negative.   Gastrointestinal: Positive for abdominal pain.  Genitourinary:       Vaginal discharge  Musculoskeletal: Negative.   Skin: Negative.   Neurological: Negative.   Psychiatric/Behavioral: Negative.    Physical Exam   Blood pressure 95/61, pulse 97, temperature 97.7 F (36.5 C), temperature source Oral, resp. rate 20, height 5\' 5"  (1.651 m), weight 71.215 kg (157 lb), last  menstrual period 07/30/2012, SpO2 100.00%.  Physical Exam  Constitutional: She is oriented to person, place, and time. She appears well-developed and well-nourished. No distress.  HENT:  Head: Normocephalic.  Eyes: Pupils are equal, round, and reactive to light.  Neck: Normal range of motion.  Cardiovascular: Normal rate, regular rhythm and normal heart sounds.   Respiratory: Effort normal and breath sounds normal.  GI: Soft. Bowel sounds are normal. She exhibits no distension and no mass. There is no tenderness. There is no rebound and no guarding.  Genitourinary: Vagina normal and uterus normal. No vaginal discharge found.  Musculoskeletal: Normal range of motion.  Neurological: She is alert and oriented to person, place, and time.  Skin: Skin is warm and dry.  Psychiatric: She has a normal mood and affect.   Results for orders placed during the hospital encounter of 02/07/13 (from the past 24 hour(s))  URINALYSIS, ROUTINE W REFLEX MICROSCOPIC     Status: Abnormal   Collection Time    02/07/13 12:38 AM      Result Value Range   Color, Urine YELLOW  YELLOW   APPearance CLEAR  CLEAR   Specific Gravity, Urine >1.030 (*) 1.005 - 1.030   pH 6.0  5.0 - 8.0   Glucose, UA NEGATIVE  NEGATIVE mg/dL   Hgb urine dipstick NEGATIVE  NEGATIVE   Bilirubin Urine  NEGATIVE  NEGATIVE   Ketones, ur NEGATIVE  NEGATIVE mg/dL   Protein, ur NEGATIVE  NEGATIVE mg/dL   Urobilinogen, UA 0.2  0.0 - 1.0 mg/dL   Nitrite NEGATIVE  NEGATIVE   Leukocytes, UA NEGATIVE  NEGATIVE  WET PREP, GENITAL     Status: Abnormal   Collection Time    02/07/13  1:40 AM      Result Value Range   Yeast Wet Prep HPF POC NONE SEEN  NONE SEEN   Trich, Wet Prep NONE SEEN  NONE SEEN   Clue Cells Wet Prep HPF POC NONE SEEN  NONE SEEN   WBC, Wet Prep HPF POC FEW (*) NONE SEEN     MAU Course  Procedures  MDM  Wet Prep, percocet, phenergan, fluids  Assessment and Plan   A: Pelvic discomfort likely uterine irritability  from dehydration P: Fluids, percocet, phenergan tonight Home rest and hydrate  Carolynn Serve 02/07/2013, 1:30 AM

## 2013-04-17 ENCOUNTER — Encounter (HOSPITAL_COMMUNITY): Payer: Self-pay

## 2013-04-17 ENCOUNTER — Inpatient Hospital Stay (HOSPITAL_COMMUNITY)
Admission: AD | Admit: 2013-04-17 | Discharge: 2013-04-17 | Disposition: A | Discharge status: Home / Self Care | Payer: Medicaid Other | Source: Ambulatory Visit | Attending: Obstetrics and Gynecology | Admitting: Obstetrics and Gynecology

## 2013-04-17 DIAGNOSIS — O479 False labor, unspecified: Secondary | ICD-10-CM | POA: Insufficient documentation

## 2013-04-17 MED ORDER — ZOLPIDEM TARTRATE 5 MG PO TABS
5.0000 mg | ORAL_TABLET | Freq: Once | ORAL | Status: AC
  Administered 2013-04-17: 5 mg via ORAL
  Filled 2013-04-17: qty 1

## 2013-04-28 ENCOUNTER — Telehealth (HOSPITAL_COMMUNITY): Payer: Self-pay | Admitting: *Deleted

## 2013-04-28 ENCOUNTER — Encounter (HOSPITAL_COMMUNITY): Payer: Self-pay | Admitting: *Deleted

## 2013-04-28 NOTE — Telephone Encounter (Signed)
Preadmission screen  

## 2013-05-05 ENCOUNTER — Encounter (HOSPITAL_COMMUNITY): Payer: Self-pay | Admitting: *Deleted

## 2013-05-05 ENCOUNTER — Emergency Department (HOSPITAL_BASED_OUTPATIENT_CLINIC_OR_DEPARTMENT_OTHER)
Admission: EM | Admit: 2013-05-05 | Discharge: 2013-05-05 | Disposition: A | Discharge status: Home / Self Care | Payer: Medicaid Other | Source: Home / Self Care | Attending: Emergency Medicine | Admitting: Emergency Medicine

## 2013-05-05 ENCOUNTER — Inpatient Hospital Stay (HOSPITAL_COMMUNITY)
Admission: AD | Admit: 2013-05-05 | Discharge: 2013-05-05 | Disposition: A | Discharge status: Home / Self Care | Payer: Medicaid Other | Source: Ambulatory Visit | Attending: Obstetrics and Gynecology | Admitting: Obstetrics and Gynecology

## 2013-05-05 ENCOUNTER — Inpatient Hospital Stay (HOSPITAL_COMMUNITY): Payer: Medicaid Other

## 2013-05-05 ENCOUNTER — Encounter (HOSPITAL_BASED_OUTPATIENT_CLINIC_OR_DEPARTMENT_OTHER): Payer: Self-pay | Admitting: Emergency Medicine

## 2013-05-05 DIAGNOSIS — O368131 Decreased fetal movements, third trimester, fetus 1: Secondary | ICD-10-CM

## 2013-05-05 DIAGNOSIS — O479 False labor, unspecified: Secondary | ICD-10-CM | POA: Insufficient documentation

## 2013-05-05 DIAGNOSIS — Z36 Encounter for antenatal screening of mother: Secondary | ICD-10-CM

## 2013-05-05 DIAGNOSIS — O368190 Decreased fetal movements, unspecified trimester, not applicable or unspecified: Secondary | ICD-10-CM | POA: Insufficient documentation

## 2013-05-05 DIAGNOSIS — O99330 Smoking (tobacco) complicating pregnancy, unspecified trimester: Secondary | ICD-10-CM | POA: Insufficient documentation

## 2013-05-05 DIAGNOSIS — Z3689 Encounter for other specified antenatal screening: Secondary | ICD-10-CM

## 2013-05-05 HISTORY — DX: Encounter for supervision of normal pregnancy, unspecified, unspecified trimester: Z34.90

## 2013-05-05 LAB — URINALYSIS, ROUTINE W REFLEX MICROSCOPIC
Bilirubin Urine: NEGATIVE
Urobilinogen, UA: 1 mg/dL (ref 0.0–1.0)

## 2013-05-05 MED ORDER — FAMOTIDINE 20 MG PO TABS
20.0000 mg | ORAL_TABLET | Freq: Once | ORAL | Status: AC
  Administered 2013-05-05: 20 mg via ORAL
  Filled 2013-05-05: qty 1

## 2013-05-05 NOTE — MAU Note (Signed)
Pt returned from US

## 2013-05-05 NOTE — ED Notes (Signed)
Pt taken directly to room 14 upon arrival for evaluation. Reports not feeling fetal movement for 2 days and "only felt one kick Saturday night". Fetal Heart tones 120s by doppler. Kerrie Buffalo, NP at bedside to eval. Movement noted at this time

## 2013-05-05 NOTE — Progress Notes (Signed)
Charge RN @[facility]  called [facility] to tell of pt just put on monitor with c/o decreased fetal movement for over 24hrs.  Pt has no other complaints/concerns and is scheduled for iol 05/06/13.

## 2013-05-05 NOTE — ED Notes (Signed)
Pt reports feeling good fetal movement since arrival

## 2013-05-05 NOTE — Progress Notes (Signed)
The ed-physician spoke with Dr Jackelyn Knife about pt c/o decreased fetal movement, fhr; Dr Jackelyn Knife wants pt to be discharged from [facility] and to come straight to [facility].  [facility] spoke with Lynette,RN-MAU and told of pt coming to MAU using own transportation.

## 2013-05-05 NOTE — MAU Provider Note (Signed)
History     CSN: [id number]  Arrival date and time: 05/05/13 0240   First Provider Initiated Contact with Patient 05/05/13 0308      Chief Complaint  Patient presents with  . Decreased Fetal Movement   HPI Ms. Brandi Hoffman is a 22 y.o. G2P1001 at [redacted]w[redacted]d who presents to MAU today from [facility] with decreased fetal movement x 24 hours. Report from [facility] was NST showing minimal variability and FHR 120 bpm baseline. Patient denies N/V/D or constipation, vaginal bleeding, discharge or LOF. She states normal appetite and intake. She denies complications with this pregnancy or previous pregnancy. She reports improved fetal movement since arrival in MAU and increased contractions as well.     OB History   Grav Para Term Preterm Abortions TAB SAB Ect Mult Living   2 1 1       1       Past Medical History  Diagnosis Date  . ADHD (attention deficit hyperactivity disorder)   . Scoliosis   . GERD (gastroesophageal reflux disease)   . Pregnant     Past Surgical History  Procedure Laterality Date  . Multiple tooth extractions      Family History  Problem Relation Age of Onset  . Adopted: Yes    History  Substance Use Topics  . Smoking status: Current Some Day Smoker  . Smokeless tobacco: Not on file  . Alcohol Use: No    Allergies: No Known Allergies  Prescriptions prior to admission  Medication Sig Dispense Refill  . Prenatal Vit-Fe Fumarate-FA (MULTIVITAMIN-PRENATAL) 27-0.8 MG TABS Take 2 tablets by mouth daily at 12 noon.      . ranitidine (ZANTAC) 150 MG tablet Take 150 mg by mouth 2 (two) times daily.      . calcium carbonate (TUMS - DOSED IN MG ELEMENTAL CALCIUM) 500 MG chewable tablet Chew 1 tablet by mouth daily.      . cetirizine (ZYRTEC) 10 MG tablet Take 10 mg by mouth daily.        Review of Systems  Constitutional: Negative for fever and malaise/fatigue.  Gastrointestinal: Negative for nausea, vomiting, abdominal pain, diarrhea and constipation.   Genitourinary: Negative for dysuria, urgency and frequency.       Neg - vaginal bleeding, discharge, LOF   Physical Exam   Blood pressure 104/64, pulse 104, temperature 98 F (36.7 C), temperature source Oral, resp. rate 18, height 5\' 6"  (1.676 m), weight 173 lb 3.2 oz (78.563 kg), last menstrual period 07/30/2012.  Physical Exam  Constitutional: She is oriented to person, place, and time. She appears well-developed and well-nourished. No distress.  HENT:  Head: Normocephalic and atraumatic.  Cardiovascular: Normal rate, regular rhythm and normal heart sounds.   Respiratory: Effort normal and breath sounds normal. No respiratory distress.  GI: Soft. Bowel sounds are normal. She exhibits no distension and no mass. There is no tenderness. There is no rebound and no guarding.  Neurological: She is alert and oriented to person, place, and time.  Skin: Skin is warm and dry. No erythema.  Psychiatric: She has a normal mood and affect.  Dilation: 1.5 Effacement (%): Thick Cervical Position: Posterior Station: -3 Presentation: Vertex Exam by:: Rudi Coco RN  Results for orders placed during the hospital encounter of 05/05/13 (from the past 24 hour(s))  URINALYSIS, ROUTINE W REFLEX MICROSCOPIC     Status: Abnormal   Collection Time    05/05/13  2:00 AM      Result Value Range   Color, Urine  YELLOW  YELLOW   APPearance CLOUDY (*) CLEAR   Specific Gravity, Urine 1.021  1.005 - 1.030   pH 6.5  5.0 - 8.0   Glucose, UA NEGATIVE  NEGATIVE mg/dL   Hgb urine dipstick NEGATIVE  NEGATIVE   Bilirubin Urine NEGATIVE  NEGATIVE   Ketones, ur NEGATIVE  NEGATIVE mg/dL   Protein, ur NEGATIVE  NEGATIVE mg/dL   Urobilinogen, UA 1.0  0.0 - 1.0 mg/dL   Nitrite NEGATIVE  NEGATIVE   Leukocytes, UA TRACE (*) NEGATIVE  URINE MICROSCOPIC-ADD ON     Status: Abnormal   Collection Time    05/05/13  2:00 AM      Result Value Range   Squamous Epithelial / LPF MANY (*) RARE   WBC, UA 0-2  <3 WBC/hpf   RBC /  HPF 0-2  <3 RBC/hpf   Bacteria, UA FEW (*) RARE   Urine-Other MUCOUS PRESENT     Fetal Monitoring: Baseline: 120 bpm, moderate variability, + accelerations, occasional variable decelerations Contractions: irregular, q 3-6 minutes with mild UI  MAU Course  Procedures None  MDM Discussed with Dr. Jackelyn Knife. He reviewed the patient's NST and would like to order BPP and AFI. If normal patient can be discharged and follow-up in the office as scheduled.  BPP 8/8, AFI subjectively low-normal  Assessment and Plan  A: Reactive NST  P: Discharge home Kick counts and labor precautions discussed Patient advised to follow-up in the office as scheduled this week Patient may return to MAU as needed or if her condition were to change or worsen   Freddi Starr, PA-C  05/05/2013, 4:51 AM

## 2013-05-05 NOTE — ED Notes (Signed)
Pt d/c and instructed to go directly to [facility] per EDP Palumbo who spoke Dr Jackelyn Knife. Report called to Robbie Lis, RN charge nurse maternity admissions

## 2013-05-05 NOTE — ED Provider Notes (Signed)
CSN: [id number]     Arrival date & time 05/05/13  0056 History   First MD Initiated Contact with Patient 05/05/13 0125     Chief Complaint  Patient presents with  . Decreased Fetal Movement   (Consider location/radiation/quality/duration/timing/severity/associated sxs/prior Treatment) The history is provided by the patient. No language interpreter was used.  Patient presents at greater than 39 weeks (EDC 05/08/13 based on [place]) with reports of no fetal movement since 8 pm Saturday (> 24 hours) No associated symptoms.  No gush of fluid.    Past Medical History  Diagnosis Date  . ADHD (attention deficit hyperactivity disorder)   . Scoliosis   . GERD (gastroesophageal reflux disease)   . Pregnant    Past Surgical History  Procedure Laterality Date  . Multiple tooth extractions     Family History  Problem Relation Age of Onset  . Adopted: Yes   History  Substance Use Topics  . Smoking status: Current Some Day Smoker  . Smokeless tobacco: Not on file  . Alcohol Use: No   OB History   Grav Para Term Preterm Abortions TAB SAB Ect Mult Living   2 1 1       1      Review of Systems  All other systems reviewed and are negative.    Allergies  Review of patient's allergies indicates no known allergies.  Home Medications   Current Outpatient Rx  Name  Route  Sig  Dispense  Refill  . Prenatal Vit-Fe Fumarate-FA (MULTIVITAMIN-PRENATAL) 27-0.8 MG TABS   Oral   Take 2 tablets by mouth daily at 12 noon.         . calcium carbonate (TUMS - DOSED IN MG ELEMENTAL CALCIUM) 500 MG chewable tablet   Oral   Chew 1 tablet by mouth daily.         . cetirizine (ZYRTEC) 10 MG tablet   Oral   Take 10 mg by mouth daily.         . ranitidine (ZANTAC) 150 MG tablet   Oral   Take 150 mg by mouth 2 (two) times daily.          BP 101/65  Pulse 95  Temp(Src) 98.4 F (36.9 C) (Oral)  Resp 16  SpO2 100%  LMP 07/30/2012 Physical Exam  Constitutional: She is oriented to person,  place, and time. She appears well-developed and well-nourished.  HENT:  Head: Normocephalic and atraumatic.  Eyes: Conjunctivae and EOM are normal.  Neck: Normal range of motion. Neck supple.  Cardiovascular: Normal rate, regular rhythm and intact distal pulses.   Pulmonary/Chest: Effort normal and breath sounds normal. She has no wheezes. She has no rales.  Abdominal: Bowel sounds are normal. There is no rebound and no guarding.  Gravid, palpable fetal movement  Musculoskeletal: Normal range of motion.  Neurological: She is alert and oriented to person, place, and time.  Skin: Skin is warm and dry.  Psychiatric: She has a normal mood and affect.    ED Course  Procedures (including critical care time) Labs Review Labs Reviewed  URINALYSIS, ROUTINE W REFLEX MICROSCOPIC   Imaging Review No results found.  EKG Interpretation   None       MDM   1. Decreased fetal movement in pregnancy, antepartum, third trimester, fetus 1    Case d/w Dr. Jackelyn Knife on call for Dr. Senaida Ores.  Sent to the MAU Case d/w provider in MAU.     Jasmine Awe, MD 05/05/13 [phone]

## 2013-05-05 NOTE — MAU Note (Signed)
Decreased fetal movement x 2-3 days.

## 2013-05-07 ENCOUNTER — Inpatient Hospital Stay (HOSPITAL_COMMUNITY)
Admission: RE | Admit: 2013-05-07 | Discharge: 2013-05-09 | DRG: 775 | Disposition: A | Discharge status: Home / Self Care | Payer: Medicaid Other | Source: Ambulatory Visit | Attending: Obstetrics and Gynecology | Admitting: Obstetrics and Gynecology

## 2013-05-07 ENCOUNTER — Inpatient Hospital Stay (HOSPITAL_COMMUNITY): Payer: Medicaid Other | Admitting: Anesthesiology

## 2013-05-07 ENCOUNTER — Encounter (HOSPITAL_COMMUNITY): Payer: Self-pay

## 2013-05-07 ENCOUNTER — Encounter (HOSPITAL_COMMUNITY): Payer: Medicaid Other | Admitting: Anesthesiology

## 2013-05-07 ENCOUNTER — Inpatient Hospital Stay (HOSPITAL_COMMUNITY)
Admission: AD | Admit: 2013-05-07 | Discharge: 2013-05-07 | DRG: 781 | Disposition: A | Discharge status: Home / Self Care | Payer: Medicaid Other | Source: Ambulatory Visit | Attending: Obstetrics and Gynecology | Admitting: Obstetrics and Gynecology

## 2013-05-07 VITALS — BP 105/59 | HR 85 | Temp 97.6°F | Resp 18

## 2013-05-07 DIAGNOSIS — M4120 Other idiopathic scoliosis, site unspecified: Secondary | ICD-10-CM | POA: Diagnosis present

## 2013-05-07 DIAGNOSIS — O99891 Other specified diseases and conditions complicating pregnancy: Principal | ICD-10-CM | POA: Diagnosis present

## 2013-05-07 DIAGNOSIS — Z3490 Encounter for supervision of normal pregnancy, unspecified, unspecified trimester: Secondary | ICD-10-CM

## 2013-05-07 DIAGNOSIS — O99892 Other specified diseases and conditions complicating childbirth: Principal | ICD-10-CM | POA: Diagnosis present

## 2013-05-07 DIAGNOSIS — O479 False labor, unspecified: Secondary | ICD-10-CM | POA: Diagnosis present

## 2013-05-07 DIAGNOSIS — O99334 Smoking (tobacco) complicating childbirth: Secondary | ICD-10-CM | POA: Diagnosis present

## 2013-05-07 LAB — CBC
Hemoglobin: 10.9 g/dL — ABNORMAL LOW (ref 12.0–15.0)
MCH: 27.6 pg (ref 26.0–34.0)
MCV: 83.8 fL (ref 78.0–100.0)
Platelets: 253 10*3/uL (ref 150–400)
RBC: 3.95 MIL/uL (ref 3.87–5.11)
WBC: 9.6 10*3/uL (ref 4.0–10.5)

## 2013-05-07 LAB — POCT FERN TEST

## 2013-05-07 LAB — RPR: RPR Ser Ql: NONREACTIVE

## 2013-05-07 MED ORDER — LACTATED RINGERS IV SOLN: 500.0000 mL | Freq: Once | INTRAVENOUS | Status: DC

## 2013-05-07 MED ORDER — OXYCODONE-ACETAMINOPHEN 5-325 MG PO TABS: 1.0000 | ORAL_TABLET | ORAL | Status: DC | PRN

## 2013-05-07 MED ORDER — EPHEDRINE 5 MG/ML INJ
10.0000 mg | INTRAVENOUS | Status: DC | PRN
  Filled 2013-05-07: qty 4

## 2013-05-07 MED ORDER — PHENYLEPHRINE 40 MCG/ML (10ML) SYRINGE FOR IV PUSH (FOR BLOOD PRESSURE SUPPORT)
80.0000 ug | PREFILLED_SYRINGE | INTRAVENOUS | Status: DC | PRN
  Filled 2013-05-07: qty 10

## 2013-05-07 MED ORDER — OXYTOCIN BOLUS FROM INFUSION: 500.0000 mL | INTRAVENOUS | Status: DC

## 2013-05-07 MED ORDER — EPHEDRINE 5 MG/ML INJ: 10.0000 mg | INTRAVENOUS | Status: DC | PRN

## 2013-05-07 MED ORDER — OXYTOCIN 40 UNITS IN LACTATED RINGERS INFUSION - SIMPLE MED
1.0000 m[IU]/min | INTRAVENOUS | Status: DC
  Administered 2013-05-07: 2 m[IU]/min via INTRAVENOUS
  Filled 2013-05-07: qty 1000

## 2013-05-07 MED ORDER — FENTANYL 2.5 MCG/ML BUPIVACAINE 1/10 % EPIDURAL INFUSION (WH - ANES)
14.0000 mL/h | INTRAMUSCULAR | Status: DC | PRN
  Filled 2013-05-07: qty 125

## 2013-05-07 MED ORDER — LACTATED RINGERS IV SOLN
INTRAVENOUS | Status: DC
  Administered 2013-05-07 (×2): via INTRAVENOUS

## 2013-05-07 MED ORDER — BUTORPHANOL TARTRATE 1 MG/ML IJ SOLN
1.0000 mg | Freq: Once | INTRAMUSCULAR | Status: AC
  Administered 2013-05-07: 1 mg via INTRAVENOUS
  Filled 2013-05-07: qty 1

## 2013-05-07 MED ORDER — ACETAMINOPHEN 325 MG PO TABS: 650.0000 mg | ORAL_TABLET | ORAL | Status: DC | PRN

## 2013-05-07 MED ORDER — DIPHENHYDRAMINE HCL 50 MG/ML IJ SOLN: 12.5000 mg | INTRAMUSCULAR | Status: DC | PRN

## 2013-05-07 MED ORDER — LACTATED RINGERS IV SOLN: 500.0000 mL | INTRAVENOUS | Status: DC | PRN

## 2013-05-07 MED ORDER — FENTANYL 2.5 MCG/ML BUPIVACAINE 1/10 % EPIDURAL INFUSION (WH - ANES)
INTRAMUSCULAR | Status: DC | PRN
  Administered 2013-05-07: 14 mL/h via EPIDURAL

## 2013-05-07 MED ORDER — LIDOCAINE HCL (PF) 1 % IJ SOLN
INTRAMUSCULAR | Status: DC | PRN
  Administered 2013-05-07 (×3): 5 mL

## 2013-05-07 MED ORDER — OXYTOCIN 40 UNITS IN LACTATED RINGERS INFUSION - SIMPLE MED: 62.5000 mL/h | INTRAVENOUS | Status: DC

## 2013-05-07 MED ORDER — PHENYLEPHRINE 40 MCG/ML (10ML) SYRINGE FOR IV PUSH (FOR BLOOD PRESSURE SUPPORT): 80.0000 ug | PREFILLED_SYRINGE | INTRAVENOUS | Status: DC | PRN

## 2013-05-07 MED ORDER — ONDANSETRON HCL 4 MG/2ML IJ SOLN: 4.0000 mg | Freq: Four times a day (QID) | INTRAMUSCULAR | Status: DC | PRN

## 2013-05-07 MED ORDER — TERBUTALINE SULFATE 1 MG/ML IJ SOLN: 0.2500 mg | Freq: Once | INTRAMUSCULAR | Status: AC | PRN

## 2013-05-07 MED ORDER — CITRIC ACID-SODIUM CITRATE 334-500 MG/5ML PO SOLN
30.0000 mL | ORAL | Status: DC | PRN
  Administered 2013-05-07: 30 mL via ORAL
  Filled 2013-05-07: qty 15

## 2013-05-07 MED ORDER — IBUPROFEN 600 MG PO TABS
600.0000 mg | ORAL_TABLET | Freq: Four times a day (QID) | ORAL | Status: DC | PRN
  Filled 2013-05-07: qty 1

## 2013-05-07 MED ORDER — LIDOCAINE HCL (PF) 1 % IJ SOLN
30.0000 mL | INTRAMUSCULAR | Status: DC | PRN
  Filled 2013-05-07: qty 30

## 2013-05-07 NOTE — Progress Notes (Signed)
Dr Senaida Ores called back and notified of patient. She is aware that patient is advised to go home and return for her induction due to bed shortage. Aware of tracing, ctx pattern, negative fern slide. Dr Senaida Ores order is to reassure patient, may do cervical exam and discharge home.

## 2013-05-07 NOTE — H&P (Signed)
Brandi Hoffman is a 22 y.o. female G2P1001 at 39+weeks (EDD 05/08/13 by 6 week [place]) presenting for IOL at term. Prenatal care has been uncomplicated.    Maternal Medical History:  Contractions: Frequency: irregular.   Perceived severity is mild.    Fetal activity: Perceived fetal activity is normal.    Prenatal Complications - Diabetes: none.    OB History   Grav Para Term Preterm Abortions TAB SAB Ect Mult Living   2 1 1       1     2012 NSVD 7#9oz  Past Medical History  Diagnosis Date  . ADHD (attention deficit hyperactivity disorder)   . Scoliosis   . GERD (gastroesophageal reflux disease)   . Pregnant    Past Surgical History  Procedure Laterality Date  . Multiple tooth extractions     Family History: family history is not on file. She was adopted. Social History:  reports that she has been smoking.  She does not have any smokeless tobacco history on file. She reports that she does not drink alcohol or use illicit drugs.   Prenatal Transfer Tool  Maternal Diabetes: No Genetic Screening: Declined Maternal Ultrasounds/Referrals: Normal Fetal Ultrasounds or other Referrals:  None Maternal Substance Abuse:  No Significant Maternal Medications:  None Significant Maternal Lab Results:  None Other Comments:  None  ROS    Last menstrual period 07/30/2012. Maternal Exam:  Uterine Assessment: Contraction strength is mild.  Contraction frequency is irregular.   Abdomen: Patient reports no abdominal tenderness. Fetal presentation: vertex  Introitus: Normal vulva. Normal vagina.    Physical Exam  Constitutional: She is oriented to person, place, and time. She appears well-developed and well-nourished.  Cardiovascular: Normal rate and regular rhythm.   Respiratory: Effort normal.  GI: Soft.  Genitourinary: Vagina normal and uterus normal.  Neurological: She is alert and oriented to person, place, and time.  Psychiatric: She has a normal mood and affect. Her  behavior is normal.    Prenatal labs: ABO, Rh: A/Positive/-- (07/09 0000) Antibody: Negative (07/09 0000) Rubella: Immune (07/09 0000) RPR: Nonreactive (07/09 0000)  HBsAg: Negative (07/09 0000)  HIV: Non-reactive (07/09 0000)  GBS: Negative (12/22 0000)  One hour GTT112 Declined genetics  Assessment/Plan: Pt for IOL at term.  Plan AROM and pitocin   Dominik Lauricella W 05/07/2013, 6:04 AM

## 2013-05-07 NOTE — Progress Notes (Signed)
Patient ID: Brandi Hoffman, female   DOB: 11-16-1990, 22 y.o.   MRN: [id number] Pt just getting uncomfortable Received stadol x 1  And has helped Will follow progress. Epidural prn Per RN cervix still 2cm just prior to stadol

## 2013-05-07 NOTE — Anesthesia Procedure Notes (Signed)
Epidural Patient location during procedure: OB  Staffing Anesthesiologist: Tressie Ragin Performed by: anesthesiologist   Preanesthetic Checklist Completed: patient identified, site marked, surgical consent, pre-op evaluation, timeout performed, IV checked, risks and benefits discussed and monitors and equipment checked  Epidural Patient position: sitting Prep: ChloraPrep Patient monitoring: heart rate, continuous pulse ox and blood pressure Approach: right paramedian Injection technique: LOR saline  Needle:  Needle type: Tuohy  Needle gauge: 17 G Needle length: 9 cm and 9 Needle insertion depth: 6 cm Catheter type: closed end flexible Catheter size: 20 Guage Catheter at skin depth: 11 cm Test dose: negative  Assessment Events: blood not aspirated, injection not painful, no injection resistance, negative IV test and no paresthesia  Additional Notes   Patient tolerated the insertion well without complications.   

## 2013-05-07 NOTE — MAU Note (Signed)
Patient states she was scheduled for IOL today but thinks she may have ruptured membranes. Will assess in MAU for ROM.

## 2013-05-07 NOTE — MAU Note (Signed)
Patient was here for her induction, awaiting for a room assignment. She states that she is leaking clear mucus/watery fluid since 0600am. She c/o ctx that are becoming painful. Reports good fetal movement,

## 2013-05-07 NOTE — Anesthesia Preprocedure Evaluation (Signed)
Anesthesia Evaluation  Patient identified by MRN, date of birth, ID band Patient awake    Reviewed: Allergy & Precautions, H&P , NPO status , Patient's Chart, lab work & pertinent test results  History of Anesthesia Complications Negative for: history of anesthetic complications  Airway Mallampati: II TM Distance: >3 FB Neck ROM: full    Dental no notable dental hx. (+) Teeth Intact   Pulmonary neg pulmonary ROS, Current Smoker,  breath sounds clear to auscultation  Pulmonary exam normal       Cardiovascular negative cardio ROS  Rhythm:regular Rate:Normal     Neuro/Psych negative neurological ROS  negative psych ROS   GI/Hepatic negative GI ROS, Neg liver ROS,   Endo/Other  negative endocrine ROS  Renal/GU negative Renal ROS  negative genitourinary   Musculoskeletal   Abdominal Normal abdominal exam  (+)   Peds  Hematology negative hematology ROS (+)   Anesthesia Other Findings   Reproductive/Obstetrics (+) Pregnancy                           Anesthesia Physical Anesthesia Plan  ASA: II  Anesthesia Plan: Epidural   Post-op Pain Management:    Induction:   Airway Management Planned:   Additional Equipment:   Intra-op Plan:   Post-operative Plan:   Informed Consent: I have reviewed the patients History and Physical, chart, labs and discussed the procedure including the risks, benefits and alternatives for the proposed anesthesia with the patient or authorized representative who has indicated his/her understanding and acceptance.     Plan Discussed with:   Anesthesia Plan Comments:         Anesthesia Quick Evaluation  

## 2013-05-07 NOTE — Progress Notes (Signed)
Patient ID: Brandi Hoffman, female   DOB: [birth year], 22 y.o.   MRN: [id number] Pt has been on hold all day and just in for delivery FHR category 1 Cervix 50/2/-2 AROM clear Start pitocin

## 2013-05-08 ENCOUNTER — Encounter (HOSPITAL_COMMUNITY): Payer: Self-pay

## 2013-05-08 LAB — CBC
HCT: 31 % — ABNORMAL LOW (ref 36.0–46.0)
Hemoglobin: 10.5 g/dL — ABNORMAL LOW (ref 12.0–15.0)
MCH: 28.6 pg (ref 26.0–34.0)
MCHC: 33.9 g/dL (ref 30.0–36.0)
MCV: 84.5 fL (ref 78.0–100.0)
Platelets: 216 10*3/uL (ref 150–400)
RBC: 3.67 MIL/uL — AB (ref 3.87–5.11)
RDW: 14.5 % (ref 11.5–15.5)
WBC: 13.1 10*3/uL — AB (ref 4.0–10.5)

## 2013-05-08 MED ORDER — TETANUS-DIPHTH-ACELL PERTUSSIS 5-2.5-18.5 LF-MCG/0.5 IM SUSP: 0.5000 mL | Freq: Once | INTRAMUSCULAR | Status: DC

## 2013-05-08 MED ORDER — ZOLPIDEM TARTRATE 5 MG PO TABS: 5.0000 mg | ORAL_TABLET | Freq: Every evening | ORAL | Status: DC | PRN

## 2013-05-08 MED ORDER — OXYCODONE-ACETAMINOPHEN 5-325 MG PO TABS
1.0000 | ORAL_TABLET | ORAL | Status: DC | PRN
  Administered 2013-05-08: 2 via ORAL
  Administered 2013-05-08 – 2013-05-09 (×3): 1 via ORAL
  Filled 2013-05-08 (×5): qty 1

## 2013-05-08 MED ORDER — ONDANSETRON HCL 4 MG/2ML IJ SOLN: 4.0000 mg | INTRAMUSCULAR | Status: DC | PRN

## 2013-05-08 MED ORDER — SIMETHICONE 80 MG PO CHEW: 80.0000 mg | CHEWABLE_TABLET | ORAL | Status: DC | PRN

## 2013-05-08 MED ORDER — SENNOSIDES-DOCUSATE SODIUM 8.6-50 MG PO TABS
2.0000 | ORAL_TABLET | ORAL | Status: DC
  Administered 2013-05-08: 2 via ORAL
  Filled 2013-05-08: qty 2

## 2013-05-08 MED ORDER — LANOLIN HYDROUS EX OINT: TOPICAL_OINTMENT | CUTANEOUS | Status: DC | PRN

## 2013-05-08 MED ORDER — IBUPROFEN 600 MG PO TABS
600.0000 mg | ORAL_TABLET | Freq: Four times a day (QID) | ORAL | Status: DC
  Administered 2013-05-08 – 2013-05-09 (×5): 600 mg via ORAL
  Filled 2013-05-08 (×5): qty 1

## 2013-05-08 MED ORDER — PRENATAL MULTIVITAMIN CH
1.0000 | ORAL_TABLET | Freq: Every day | ORAL | Status: DC
  Filled 2013-05-08: qty 1

## 2013-05-08 MED ORDER — ONDANSETRON HCL 4 MG PO TABS: 4.0000 mg | ORAL_TABLET | ORAL | Status: DC | PRN

## 2013-05-08 MED ORDER — BENZOCAINE-MENTHOL 20-0.5 % EX AERO
1.0000 "application " | INHALATION_SPRAY | CUTANEOUS | Status: DC | PRN
  Administered 2013-05-08: 1 via TOPICAL
  Filled 2013-05-08 (×2): qty 56

## 2013-05-08 MED ORDER — WITCH HAZEL-GLYCERIN EX PADS: 1.0000 "application " | MEDICATED_PAD | CUTANEOUS | Status: DC | PRN

## 2013-05-08 MED ORDER — DIBUCAINE 1 % RE OINT
1.0000 "application " | TOPICAL_OINTMENT | RECTAL | Status: DC | PRN
  Filled 2013-05-08: qty 28

## 2013-05-08 MED ORDER — DIPHENHYDRAMINE HCL 25 MG PO CAPS: 25.0000 mg | ORAL_CAPSULE | Freq: Four times a day (QID) | ORAL | Status: DC | PRN

## 2013-05-08 NOTE — Anesthesia Postprocedure Evaluation (Signed)
  Anesthesia Post-op Note  Patient: Brandi Hoffman  Procedure(s) Performed: * No procedures listed *  Patient Location: PACU and Mother/Baby  Anesthesia Type:Epidural  Level of Consciousness: awake, alert  and oriented  Airway and Oxygen Therapy: Patient Spontanous Breathing  Post-op Pain: mild  Post-op Assessment: Patient's Cardiovascular Status Stable, Respiratory Function Stable, No signs of Nausea or vomiting and Pain level controlled  Post-op Vital Signs: stable  Complications: No apparent anesthesia complications

## 2013-05-08 NOTE — Progress Notes (Signed)
PPD #0 No problems Afeb, VSS Fundus firm, NT at U-1 Continue routine postpartum care 

## 2013-05-08 NOTE — Discharge Summary (Signed)
Obstetric Discharge Summary Reason for Admission: induction of labor Prenatal Procedures: none Intrapartum Procedures: spontaneous vaginal delivery Postpartum Procedures: none Complications-Operative and Postpartum: none Hemoglobin  Date Value Range Status  05/08/2013 10.5* 12.0 - 15.0 g/dL Final     HCT  Date Value Range Status  05/08/2013 31.0* 36.0 - 46.0 % Final    Physical Exam:  General: alert and cooperative Lochia: appropriate Uterine Fundus: firm   Discharge Diagnoses: Term Pregnancy-delivered  Discharge Information: Date: 05/09/2013 Activity: pelvic rest Diet: routine Medications: Ibuprofen and Percocet Condition: improved Instructions: refer to practice specific booklet Discharge to: home Follow-up Information   Follow up with Oliver PilaICHARDSON,Kaesen Rodriguez W, MD. Schedule an appointment as soon as possible for a visit in 6 weeks.   Specialty:  Obstetrics and Gynecology   Contact information:   [address], [address] [place] [address] [phone]       Newborn Data: Live born female  Birth Weight: 5 lb 14.9 oz (2690 g) APGAR: 9, 9  Home with mother.  Oliver PilaICHARDSON,Analycia Khokhar W 05/09/2013, 9:04 AM

## 2013-05-08 NOTE — Lactation Note (Signed)
This note was copied from the [facility]. Lactation Consultation Note Initial visit at 18 hours of age.  Wh LC resources given and discussed.  Mom reports a little nipple pain, encouraged hand expressed colostrum rubbed into nipples for pain.  No bruising or abrasions noted at this time.  Baby with paci in crib.  Discussed not using a paci and reasons.  Assisted with placing baby skin to skin in football hold on left breast.  Baby latches well with wide flanged lips and suckling burst with swallows observed.  Discussed early feeding cues, and feeding frequency and output for newborn.  Mom reports this latch feels much better. Mom to call for assist as needed.  Patient Name: Brandi Hoffman VHQIO'NToday's Date: 05/08/2013 Reason for consult: Initial assessment   Maternal Data Formula Feeding for Exclusion: No Has patient been taught Hand Expression?: Yes Does the patient have breastfeeding experience prior to this delivery?: No  Feeding Feeding Type: Breast Fed Length of feed:  (5 minutes observed)  LATCH Score/Interventions Latch: Grasps breast easily, tongue down, lips flanged, rhythmical sucking. Intervention(s): Breast compression;Adjust position;Assist with latch  Audible Swallowing: A few with stimulation Intervention(s): Skin to skin  Type of Nipple: Everted at rest and after stimulation  Comfort (Breast/Nipple): Filling, red/small blisters or bruises, mild/mod discomfort     Hold (Positioning): No assistance needed to correctly position infant at breast.  LATCH Score: 8  Lactation Tools Discussed/Used     Consult Status Consult Status: Follow-up Date: 05/09/13 Follow-up type: In-patient    Brandi Hoffman, Brandi Hoffman 05/08/2013, 6:52 PM

## 2013-05-08 NOTE — L&D Delivery Note (Signed)
Delivery Note Pt progressed to complete dilation and pushed great.  At 12:36 AM a healthy female was delivered via  (Presentation: OA).  APGAR:8 , 9; weight pending .   Placenta status:spontaneous delivery, .  Cord:  with the following complications none: .    Anesthesia:  epidural Episiotomy: n/a Lacerations:none Suture Repair: n/a Est. Blood Loss (mL): 300cc  Mom to postpartum.  Baby to Couplet care / Skin to Skin.  Oliver PilaRICHARDSON,Artemis Loyal W 05/08/2013, 12:47 AM

## 2013-05-09 MED ORDER — IBUPROFEN 600 MG PO TABS: 600.0000 mg | ORAL_TABLET | Freq: Four times a day (QID) | ORAL | Status: DC

## 2013-05-09 MED ORDER — OXYCODONE-ACETAMINOPHEN 5-325 MG PO TABS: 1.0000 | ORAL_TABLET | ORAL | Status: DC | PRN

## 2013-05-09 NOTE — Progress Notes (Signed)
UR chart review completed.  

## 2013-05-09 NOTE — Progress Notes (Signed)
Post Partum Day 1 Subjective: no complaints and tolerating PO  Requests early d/c  Objective: Blood pressure 105/59, pulse 85, temperature 97.6 F (36.4 C), temperature source Oral, resp. rate 18, last menstrual period 07/30/2012, SpO2 100.00%, unknown if currently breastfeeding.  Physical Exam:  General: alert and cooperative Lochia: appropriate Uterine Fundus: firm   Recent Labs  05/07/13 1550 05/08/13 0547  HGB 10.9* 10.5*  HCT 33.1* 31.0*    Assessment/Plan: Discharge home   LOS: 2 days   Brandi Hoffman W 05/09/2013, 9:02 AM

## 2013-10-16 ENCOUNTER — Emergency Department (HOSPITAL_BASED_OUTPATIENT_CLINIC_OR_DEPARTMENT_OTHER): Payer: Medicaid Other

## 2013-10-16 ENCOUNTER — Emergency Department (HOSPITAL_BASED_OUTPATIENT_CLINIC_OR_DEPARTMENT_OTHER)
Admission: EM | Admit: 2013-10-16 | Discharge: 2013-10-16 | Disposition: A | Discharge status: Home / Self Care | Payer: Medicaid Other | Attending: Emergency Medicine | Admitting: Emergency Medicine

## 2013-10-16 ENCOUNTER — Encounter (HOSPITAL_BASED_OUTPATIENT_CLINIC_OR_DEPARTMENT_OTHER): Payer: Self-pay | Admitting: Emergency Medicine

## 2013-10-16 DIAGNOSIS — X500XXA Overexertion from strenuous movement or load, initial encounter: Secondary | ICD-10-CM | POA: Insufficient documentation

## 2013-10-16 DIAGNOSIS — Z8659 Personal history of other mental and behavioral disorders: Secondary | ICD-10-CM | POA: Insufficient documentation

## 2013-10-16 DIAGNOSIS — Z8719 Personal history of other diseases of the digestive system: Secondary | ICD-10-CM | POA: Insufficient documentation

## 2013-10-16 DIAGNOSIS — Y9339 Activity, other involving climbing, rappelling and jumping off: Secondary | ICD-10-CM | POA: Insufficient documentation

## 2013-10-16 DIAGNOSIS — F17200 Nicotine dependence, unspecified, uncomplicated: Secondary | ICD-10-CM | POA: Insufficient documentation

## 2013-10-16 DIAGNOSIS — Z8739 Personal history of other diseases of the musculoskeletal system and connective tissue: Secondary | ICD-10-CM | POA: Insufficient documentation

## 2013-10-16 DIAGNOSIS — Z791 Long term (current) use of non-steroidal anti-inflammatories (NSAID): Secondary | ICD-10-CM | POA: Insufficient documentation

## 2013-10-16 DIAGNOSIS — S93409A Sprain of unspecified ligament of unspecified ankle, initial encounter: Secondary | ICD-10-CM

## 2013-10-16 DIAGNOSIS — Y929 Unspecified place or not applicable: Secondary | ICD-10-CM | POA: Insufficient documentation

## 2013-10-16 MED ORDER — TRAMADOL HCL 50 MG PO TABS: 50.0000 mg | ORAL_TABLET | Freq: Four times a day (QID) | ORAL | Status: DC | PRN

## 2013-10-16 NOTE — ED Provider Notes (Signed)
CSN: [id number]     Arrival date & time 10/16/13  1021 History   First MD Initiated Contact with Patient 10/16/13 1101     Chief Complaint  Patient presents with  . Foot Injury     (Consider location/radiation/quality/duration/timing/severity/associated sxs/prior Treatment) Patient is a 23 y.o. female presenting with ankle pain. The history is provided by the patient.  Ankle Pain Location:  Ankle Time since incident:  1 day Injury: yes   Mechanism of injury comment:  Climbing up a pool ladder and foot got stuck and twisted Ankle location:  R ankle Pain details:    Quality:  Aching, sharp and shooting   Radiates to:  Does not radiate   Severity:  Moderate   Onset quality:  Sudden   Timing:  Constant   Progression:  Unchanged Chronicity:  New Prior injury to area:  No Relieved by:  Ice, rest and acetaminophen Worsened by:  Bearing weight Associated symptoms: swelling     Past Medical History  Diagnosis Date  . ADHD (attention deficit hyperactivity disorder)   . Scoliosis   . GERD (gastroesophageal reflux disease)   . Pregnant    Past Surgical History  Procedure Laterality Date  . Multiple tooth extractions     Family History  Problem Relation Age of Onset  . Adopted: Yes   History  Substance Use Topics  . Smoking status: Current Some Day Smoker  . Smokeless tobacco: Not on file  . Alcohol Use: Yes   OB History   Grav Para Term Preterm Abortions TAB SAB Ect Mult Living   2 2 2       2      Review of Systems  All other systems reviewed and are negative.     Allergies  Review of patient's allergies indicates no known allergies.  Home Medications   Prior to Admission medications   Medication Sig Start Date End Date Taking? Authorizing Provider  ibuprofen (ADVIL,MOTRIN) 600 MG tablet Take 1 tablet (600 mg total) by mouth every 6 (six) hours. 05/09/13   Oliver Pila, MD  oxyCODONE-acetaminophen (PERCOCET/ROXICET) 5-325 MG per tablet Take 1-2 tablets  by mouth every 4 (four) hours as needed for severe pain (moderate - severe pain). 05/09/13   Oliver Pila, MD  Prenatal Vit-Fe Fumarate-FA (PRENATAL MULTIVITAMIN) TABS tablet Take 1 tablet by mouth daily at 12 noon.    Historical Provider, MD   BP 96/49  Pulse 89  Temp(Src) 98.2 F (36.8 C) (Oral)  Resp 16  Ht 5\' 5"  (1.651 m)  Wt 160 lb (72.576 kg)  BMI 26.63 kg/m2  SpO2 99%  LMP 10/14/2013  Breastfeeding? No Physical Exam  Nursing note and vitals reviewed. Constitutional: She is oriented to person, place, and time. She appears well-developed and well-nourished. No distress.  HENT:  Head: Normocephalic and atraumatic.  Eyes: EOM are normal. Pupils are equal, round, and reactive to light.  Cardiovascular: Normal rate.   Pulmonary/Chest: Effort normal.  Musculoskeletal:       Right ankle: She exhibits decreased range of motion, swelling and ecchymosis. She exhibits no deformity. Tenderness. Lateral malleolus and head of 5th metatarsal tenderness found. No proximal fibula tenderness found.       Feet:  Neurological: She is alert and oriented to person, place, and time.  Skin: Skin is warm and dry. No rash noted. No erythema.  Psychiatric: She has a normal mood and affect. Her behavior is normal.    ED Course  Procedures (including critical care time)  Labs Review Labs Reviewed - No data to display  Imaging Review Dg Ankle Complete Right  10/16/2013   CLINICAL DATA:  Ankle injury now with lateral pain  EXAM: RIGHT ANKLE - COMPLETE 3+ VIEW  COMPARISON:  Foot series of today's date.  FINDINGS: The bones are adequately mineralized. The joint mortise is preserved. The tailor dome is intact. There is no malleolar fracture. The overlying soft tissues are normal.  IMPRESSION: There is no acute bony abnormality of the right ankle.   Electronically Signed   By: David  [place]   On: 10/16/2013 11:08   Dg Foot Complete Right  10/16/2013   CLINICAL DATA:  Foot and ankle discomfort status  post trauma  EXAM: RIGHT FOOT COMPLETE - 3+ VIEW  COMPARISON:  Right ankle series of today's date  FINDINGS: The bones are adequately mineralized. There is no acute fracture nor dislocation. The soft tissues are normal.  IMPRESSION: There is no acute bony abnormality of the right foot.   Electronically Signed   By: David  [place]   On: 10/16/2013 11:07     EKG Interpretation None      MDM   Final diagnoses:  Ankle sprain    Pt with ankle sprain with neg imaging.  Treated with crutches and splint.    Gwyneth SproutWhitney Katalea Ucci, MD 10/17/13 [phone]

## 2013-10-16 NOTE — ED Notes (Signed)
Pt c/o right foot pain and swelling after slipping while climbing up a pool ladder last night. CMS intact.

## 2013-10-16 NOTE — Discharge Instructions (Signed)

## 2014-03-09 ENCOUNTER — Encounter (HOSPITAL_BASED_OUTPATIENT_CLINIC_OR_DEPARTMENT_OTHER): Payer: Self-pay | Admitting: Emergency Medicine

## 2014-05-26 ENCOUNTER — Emergency Department (HOSPITAL_BASED_OUTPATIENT_CLINIC_OR_DEPARTMENT_OTHER): Payer: Medicaid Other

## 2014-05-26 ENCOUNTER — Encounter (HOSPITAL_BASED_OUTPATIENT_CLINIC_OR_DEPARTMENT_OTHER): Payer: Self-pay | Admitting: Emergency Medicine

## 2014-05-26 ENCOUNTER — Emergency Department (HOSPITAL_BASED_OUTPATIENT_CLINIC_OR_DEPARTMENT_OTHER)
Admission: EM | Admit: 2014-05-26 | Discharge: 2014-05-26 | Disposition: A | Discharge status: Home / Self Care | Payer: Medicaid Other | Attending: Emergency Medicine | Admitting: Emergency Medicine

## 2014-05-26 DIAGNOSIS — R062 Wheezing: Secondary | ICD-10-CM

## 2014-05-26 DIAGNOSIS — Z79899 Other long term (current) drug therapy: Secondary | ICD-10-CM | POA: Insufficient documentation

## 2014-05-26 DIAGNOSIS — Z720 Tobacco use: Secondary | ICD-10-CM | POA: Insufficient documentation

## 2014-05-26 DIAGNOSIS — Z8719 Personal history of other diseases of the digestive system: Secondary | ICD-10-CM | POA: Insufficient documentation

## 2014-05-26 DIAGNOSIS — F909 Attention-deficit hyperactivity disorder, unspecified type: Secondary | ICD-10-CM | POA: Insufficient documentation

## 2014-05-26 DIAGNOSIS — Z3202 Encounter for pregnancy test, result negative: Secondary | ICD-10-CM | POA: Insufficient documentation

## 2014-05-26 DIAGNOSIS — J069 Acute upper respiratory infection, unspecified: Secondary | ICD-10-CM | POA: Insufficient documentation

## 2014-05-26 DIAGNOSIS — R05 Cough: Secondary | ICD-10-CM

## 2014-05-26 DIAGNOSIS — R059 Cough, unspecified: Secondary | ICD-10-CM

## 2014-05-26 LAB — URINE MICROSCOPIC-ADD ON

## 2014-05-26 LAB — URINALYSIS, ROUTINE W REFLEX MICROSCOPIC
BILIRUBIN URINE: NEGATIVE
Glucose, UA: NEGATIVE mg/dL
Hgb urine dipstick: NEGATIVE
Ketones, ur: NEGATIVE mg/dL
NITRITE: NEGATIVE
PH: 6.5 (ref 5.0–8.0)
Protein, ur: NEGATIVE mg/dL
SPECIFIC GRAVITY, URINE: 1.018 (ref 1.005–1.030)
UROBILINOGEN UA: 1 mg/dL (ref 0.0–1.0)

## 2014-05-26 LAB — PREGNANCY, URINE: Preg Test, Ur: NEGATIVE

## 2014-05-26 MED ORDER — ALBUTEROL SULFATE (2.5 MG/3ML) 0.083% IN NEBU
5.0000 mg | INHALATION_SOLUTION | Freq: Once | RESPIRATORY_TRACT | Status: AC
  Administered 2014-05-26: 5 mg via RESPIRATORY_TRACT
  Filled 2014-05-26: qty 6

## 2014-05-26 MED ORDER — ALBUTEROL SULFATE HFA 108 (90 BASE) MCG/ACT IN AERS
2.0000 | INHALATION_SPRAY | RESPIRATORY_TRACT | Status: DC | PRN
  Administered 2014-05-26: 2 via RESPIRATORY_TRACT

## 2014-05-26 MED ORDER — ALBUTEROL SULFATE HFA 108 (90 BASE) MCG/ACT IN AERS
INHALATION_SPRAY | RESPIRATORY_TRACT | Status: AC
  Administered 2014-05-26: 2 via RESPIRATORY_TRACT
  Filled 2014-05-26: qty 6.7

## 2014-05-26 NOTE — ED Notes (Signed)
Respiratory at bedside.

## 2014-05-26 NOTE — ED Provider Notes (Signed)
CSN: [id number]     Arrival date & time 05/26/14  1647 History   First MD Initiated Contact with Patient 05/26/14 1657     Chief Complaint  Patient presents with  . Influenza     (Consider location/radiation/quality/duration/timing/severity/associated sxs/prior Treatment) HPI Comments: Subjective fever  Patient is a 24 y.o. female presenting with flu symptoms. The history is provided by the patient. No language interpreter was used.  Influenza Presenting symptoms: cough, myalgias and shortness of breath   Severity:  Moderate Onset quality:  Gradual Duration:  1 day Progression:  Unchanged Chronicity:  New Relieved by:  Nothing Worsened by:  Nothing tried Ineffective treatments:  None tried   Past Medical History  Diagnosis Date  . ADHD (attention deficit hyperactivity disorder)   . Scoliosis   . GERD (gastroesophageal reflux disease)   . Pregnant    Past Surgical History  Procedure Laterality Date  . Multiple tooth extractions     Family History  Problem Relation Age of Onset  . Adopted: Yes   History  Substance Use Topics  . Smoking status: Current Some Day Smoker  . Smokeless tobacco: Not on file  . Alcohol Use: No   OB History    Gravida Para Term Preterm AB TAB SAB Ectopic Multiple Living   2 2 2       2      Review of Systems  Respiratory: Positive for cough and shortness of breath.   Musculoskeletal: Positive for myalgias.  All other systems reviewed and are negative.     Allergies  Review of patient's allergies indicates no known allergies.  Home Medications   Prior to Admission medications   Medication Sig Start Date End Date Taking? Authorizing Provider  ibuprofen (ADVIL,MOTRIN) 600 MG tablet Take 1 tablet (600 mg total) by mouth every 6 (six) hours. 05/09/13   Oliver PilaKathy W Richardson, MD  oxyCODONE-acetaminophen (PERCOCET/ROXICET) 5-325 MG per tablet Take 1-2 tablets by mouth every 4 (four) hours as needed for severe pain (moderate - severe pain).  05/09/13   Oliver PilaKathy W Richardson, MD  Prenatal Vit-Fe Fumarate-FA (PRENATAL MULTIVITAMIN) TABS tablet Take 1 tablet by mouth daily at 12 noon.    Historical Provider, MD  traMADol (ULTRAM) 50 MG tablet Take 1 tablet (50 mg total) by mouth every 6 (six) hours as needed. 10/16/13   Gwyneth SproutWhitney Plunkett, MD   BP 107/70 mmHg  Pulse 106  Temp(Src) 98.5 F (36.9 C) (Oral)  Resp 22  Ht 5\' 5"  (1.651 m)  Wt 140 lb (63.504 kg)  BMI 23.30 kg/m2  SpO2 98%  LMP 05/12/2014 Physical Exam  Constitutional: She is oriented to person, place, and time. She appears well-developed and well-nourished.  HENT:  Head: Normocephalic and atraumatic.  Right Ear: External ear normal.  Left Ear: External ear normal.  Nose: Rhinorrhea present.  Mouth/Throat: Posterior oropharyngeal erythema present.  Eyes: Conjunctivae are normal.  Neck: Normal range of motion. Neck supple.  Cardiovascular: Normal rate and regular rhythm.   Pulmonary/Chest: Effort normal and breath sounds normal.  Musculoskeletal: Normal range of motion.  Neurological: She is alert and oriented to person, place, and time. She exhibits normal muscle tone. Coordination normal.  Skin: Skin is warm and dry.  Psychiatric: She has a normal mood and affect.  Nursing note and vitals reviewed.   ED Course  Procedures (including critical care time) Labs Review Labs Reviewed  URINALYSIS, ROUTINE W REFLEX MICROSCOPIC  PREGNANCY, URINE    Imaging Review Dg Chest 2 View  05/26/2014  CLINICAL DATA:  Cough, shortness of breath and body aches.  EXAM: CHEST  2 VIEW  COMPARISON:  None.  FINDINGS: The heart size and mediastinal contours are within normal limits. Both lungs are clear. The visualized skeletal structures are unremarkable.  IMPRESSION: No active cardiopulmonary disease.   Electronically Signed   By: Richarda Overlie M.D.   On: 05/26/2014 17:18     EKG Interpretation None      MDM   Final diagnoses:  Cough    Pt feeling better after the treatment.  Will send home with inhaler. Educated on smoking cessation    Teressa Lower, NP 05/26/14 1834  Rolland Porter, MD 05/30/14 [phone]

## 2014-05-26 NOTE — ED Notes (Signed)
Reports cough, fever, SOB since yesterday with body aches and fatigue, A/O X4, ambulatory and in NAD

## 2014-05-26 NOTE — Discharge Instructions (Signed)
You need to stop smoking. Return for persistent or worsening symptoms Cough, Adult  A cough is a reflex that helps clear your throat and airways. It can help heal the body or may be a reaction to an irritated airway. A cough may only last 2 or 3 weeks (acute) or may last more than 8 weeks (chronic).  CAUSES Acute cough:  Viral or bacterial infections. Chronic cough:  Infections.  Allergies.  Asthma.  Post-nasal drip.  Smoking.  Heartburn or acid reflux.  Some medicines.  Chronic lung problems (COPD).  Cancer. SYMPTOMS   Cough.  Fever.  Chest pain.  Increased breathing rate.  High-pitched whistling sound when breathing (wheezing).  Colored mucus that you cough up (sputum). TREATMENT   A bacterial cough may be treated with antibiotic medicine.  A viral cough must run its course and will not respond to antibiotics.  Your caregiver may recommend other treatments if you have a chronic cough. HOME CARE INSTRUCTIONS   Only take over-the-counter or prescription medicines for pain, discomfort, or fever as directed by your caregiver. Use cough suppressants only as directed by your caregiver.  Use a cold steam vaporizer or humidifier in your bedroom or home to help loosen secretions.  Sleep in a semi-upright position if your cough is worse at night.  Rest as needed.  Stop smoking if you smoke. SEEK IMMEDIATE MEDICAL CARE IF:   You have pus in your sputum.  Your cough starts to worsen.  You cannot control your cough with suppressants and are losing sleep.  You begin coughing up blood.  You have difficulty breathing.  You develop pain which is getting worse or is uncontrolled with medicine.  You have a fever. MAKE SURE YOU:   Understand these instructions.  Will watch your condition.  Will get help right away if you are not doing well or get worse. Document Released: 10/21/2010 Document Revised: 07/17/2011 Document Reviewed: 10/21/2010 [facility]  Patient Information 2015 Brandi Hoffman, Brandi Hoffman. This information is not intended to replace advice given to you by your health care provider. Make sure you discuss any questions you have with your health care provider.  Upper Respiratory Infection, Adult An upper respiratory infection (URI) is also known as the common cold. It is often caused by a type of germ (virus). Colds are easily spread (contagious). You can pass it to others by kissing, coughing, sneezing, or drinking out of the same glass. Usually, you get better in 1 or 2 weeks.  HOME CARE   Only take medicine as told by your doctor.  Use a warm mist humidifier or breathe in steam from a hot shower.  Drink enough water and fluids to keep your pee (urine) clear or pale yellow.  Get plenty of rest.  Return to work when your temperature is back to normal or as told by your doctor. You may use a face mask and wash your hands to stop your cold from spreading. GET HELP RIGHT AWAY IF:   After the first few days, you feel you are getting worse.  You have questions about your medicine.  You have chills, shortness of breath, or brown or red spit (mucus).  You have yellow or brown snot (nasal discharge) or pain in the face, especially when you bend forward.  You have a fever, puffy (swollen) neck, pain when you swallow, or white spots in the back of your throat.  You have a bad headache, ear pain, sinus pain, or chest pain.  You have a high-pitched  whistling sound when you breathe in and out (wheezing).  You have a lasting cough or cough up blood.  You have sore muscles or a stiff neck. MAKE SURE YOU:   Understand these instructions.  Will watch your condition.  Will get help right away if you are not doing well or get worse. Document Released: 10/11/2007 Document Revised: 07/17/2011 Document Reviewed: 07/30/2013 [facility] Patient Information 2015 [place], [place]. This information is not intended to replace advice given to you by your  health care provider. Make sure you discuss any questions you have with your health care provider.

## 2014-06-17 IMAGING — CR DG ANKLE COMPLETE 3+V*R*
3 series · 3 of 3 positions shown · non-contrast
Comparison: Foot series of today's date.

CLINICAL DATA: Ankle injury now with lateral pain

EXAM:
RIGHT ANKLE - COMPLETE 3+ VIEW

[t ankle joint ap right]
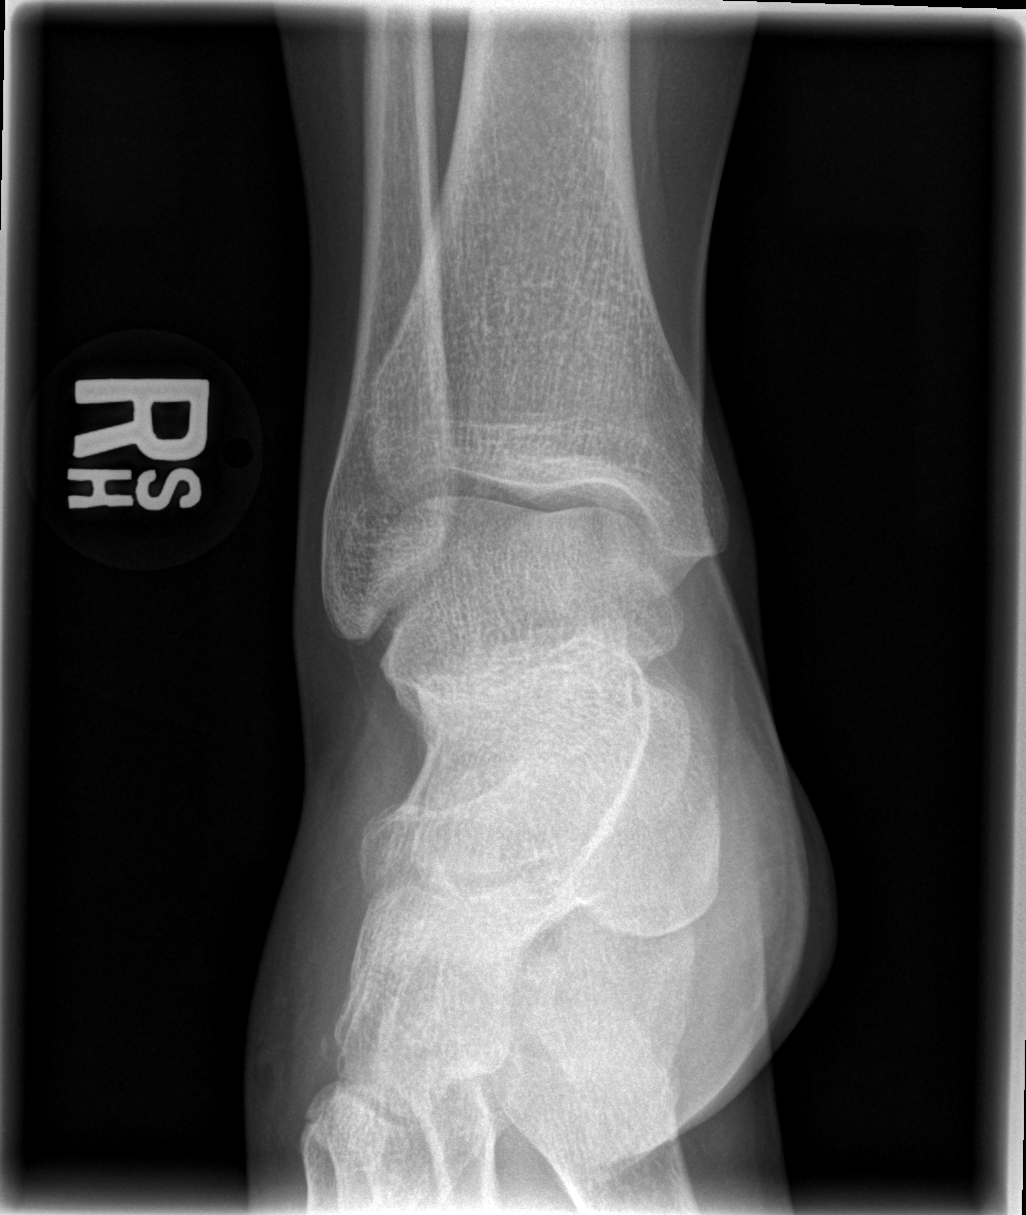

[t ankle joint oblique right]
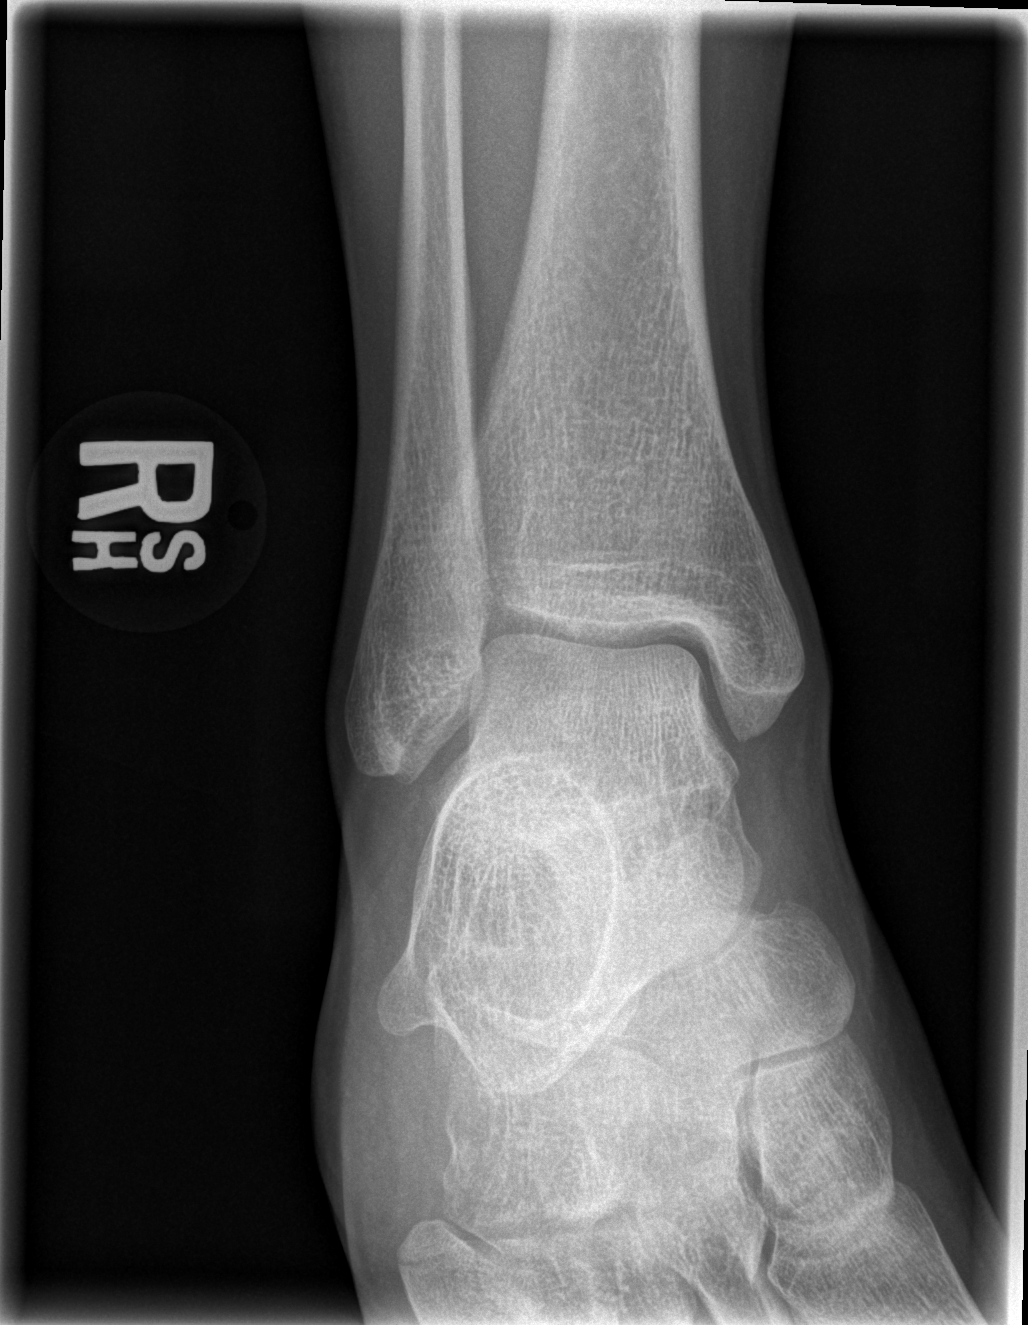

[t ankle joint lat right]
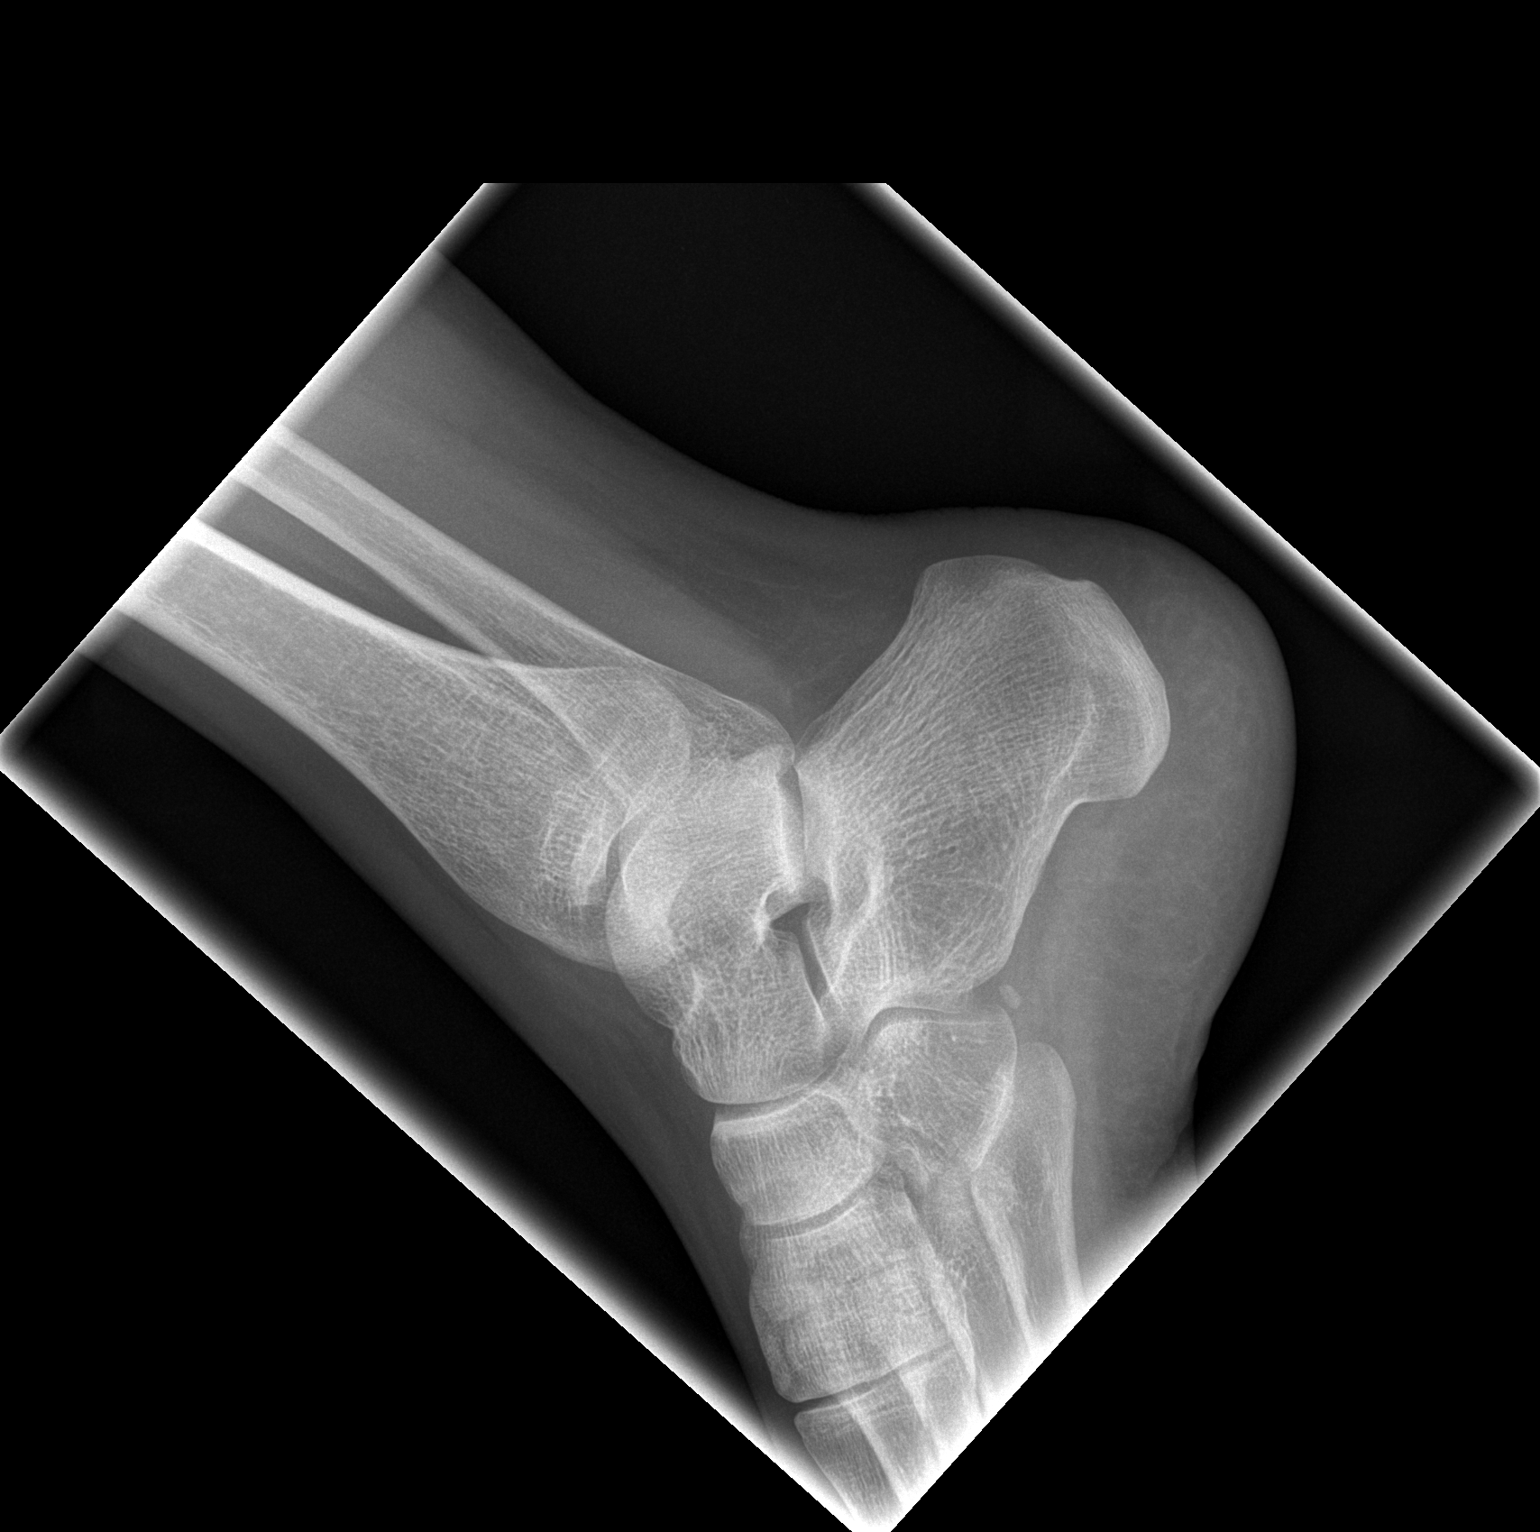

[3 of 3 positions shown; findings below may reference images not displayed]

FINDINGS: The bones are adequately mineralized. The joint mortise is
preserved. The tailor dome is intact. There is no malleolar
fracture. The overlying soft tissues are normal.
IMPRESSION: There is no acute bony abnormality of the right ankle.

## 2015-04-26 ENCOUNTER — Encounter (HOSPITAL_COMMUNITY): Payer: Self-pay | Admitting: Emergency Medicine

## 2015-04-26 ENCOUNTER — Emergency Department (HOSPITAL_COMMUNITY)
Admission: EM | Admit: 2015-04-26 | Discharge: 2015-04-26 | Disposition: A | Discharge status: Home / Self Care | Payer: Medicaid Other | Attending: Emergency Medicine | Admitting: Emergency Medicine

## 2015-04-26 DIAGNOSIS — M545 Low back pain, unspecified: Secondary | ICD-10-CM

## 2015-04-26 DIAGNOSIS — N390 Urinary tract infection, site not specified: Secondary | ICD-10-CM | POA: Insufficient documentation

## 2015-04-26 DIAGNOSIS — M419 Scoliosis, unspecified: Secondary | ICD-10-CM | POA: Insufficient documentation

## 2015-04-26 DIAGNOSIS — Z8659 Personal history of other mental and behavioral disorders: Secondary | ICD-10-CM | POA: Insufficient documentation

## 2015-04-26 DIAGNOSIS — Z8719 Personal history of other diseases of the digestive system: Secondary | ICD-10-CM | POA: Insufficient documentation

## 2015-04-26 DIAGNOSIS — Z79899 Other long term (current) drug therapy: Secondary | ICD-10-CM | POA: Insufficient documentation

## 2015-04-26 DIAGNOSIS — F17200 Nicotine dependence, unspecified, uncomplicated: Secondary | ICD-10-CM | POA: Insufficient documentation

## 2015-04-26 DIAGNOSIS — Z3202 Encounter for pregnancy test, result negative: Secondary | ICD-10-CM | POA: Insufficient documentation

## 2015-04-26 LAB — URINE MICROSCOPIC-ADD ON: RBC / HPF: NONE SEEN RBC/hpf (ref 0–5)

## 2015-04-26 LAB — URINALYSIS, ROUTINE W REFLEX MICROSCOPIC
Bilirubin Urine: NEGATIVE
Glucose, UA: NEGATIVE mg/dL
Hgb urine dipstick: NEGATIVE
Ketones, ur: NEGATIVE mg/dL
Nitrite: NEGATIVE
Protein, ur: NEGATIVE mg/dL
Specific Gravity, Urine: 1.03 (ref 1.005–1.030)
pH: 6.5 (ref 5.0–8.0)

## 2015-04-26 LAB — POC URINE PREG, ED: Preg Test, Ur: NEGATIVE

## 2015-04-26 MED ORDER — NAPROXEN 500 MG PO TABS: 500.0000 mg | ORAL_TABLET | Freq: Two times a day (BID) | ORAL | Status: DC

## 2015-04-26 MED ORDER — SULFAMETHOXAZOLE-TRIMETHOPRIM 800-160 MG PO TABS
1.0000 | ORAL_TABLET | Freq: Once | ORAL | Status: AC
  Administered 2015-04-26: 1 via ORAL
  Filled 2015-04-26: qty 1

## 2015-04-26 MED ORDER — SULFAMETHOXAZOLE-TRIMETHOPRIM 800-160 MG PO TABS: 1.0000 | ORAL_TABLET | Freq: Two times a day (BID) | ORAL | Status: DC

## 2015-04-26 MED ORDER — METHOCARBAMOL 500 MG PO TABS: 500.0000 mg | ORAL_TABLET | Freq: Two times a day (BID) | ORAL | Status: DC

## 2015-04-26 NOTE — ED Notes (Signed)
Pt complaint of lower back pain for 6 months; worse with cold weather. Pt continues to reports odorous urine; denies frequency or burning or vaginal complaint.

## 2015-04-26 NOTE — ED Notes (Signed)
Per pt, states lower back pain and urine has a foul odor-on and off for 6 months

## 2015-04-26 NOTE — ED Notes (Signed)
Bed: WA03 Expected date:  Expected time:  Means of arrival:  Comments: Hold per jen

## 2015-04-26 NOTE — Discharge Instructions (Signed)
Back Pain, Adult Back pain is very common. The pain often gets better over time. The cause of back pain is usually not dangerous. Most people can learn to manage their back pain on their own.  HOME CARE  Watch your back pain for any changes. The following actions may help to lessen any pain you are feeling:  Stay active. Start with short walks on flat ground if you can. Try to walk farther each day.  Exercise regularly as told by your doctor. Exercise helps your back heal faster. It also helps avoid future injury by keeping your muscles strong and flexible.  Do not sit, drive, or stand in one place for more than 30 minutes.  Do not stay in bed. Resting more than 1-2 days can slow down your recovery.  Be careful when you bend or lift an object. Use good form when lifting:  Bend at your knees.  Keep the object close to your body.  Do not twist.  Sleep on a firm mattress. Lie on your side, and bend your knees. If you lie on your back, put a pillow under your knees.  Take medicines only as told by your doctor.  Put ice on the injured area.  Put ice in a plastic bag.  Place a towel between your skin and the bag.  Leave the ice on for 20 minutes, 2-3 times a day for the first 2-3 days. After that, you can switch between ice and heat packs.  Avoid feeling anxious or stressed. Find good ways to deal with stress, such as exercise.  Maintain a healthy weight. Extra weight puts stress on your back. GET HELP IF:   You have pain that does not go away with rest or medicine.  You have worsening pain that goes down into your legs or buttocks.  You have pain that does not get better in one week.  You have pain at night.  You lose weight.  You have a fever or chills. GET HELP RIGHT AWAY IF:   You cannot control when you poop (bowel movement) or pee (urinate).  Your arms or legs feel weak.  Your arms or legs lose feeling (numbness).  You feel sick to your stomach (nauseous) or  throw up (vomit).  You have belly (abdominal) pain.  You feel like you may pass out (faint).   This information is not intended to replace advice given to you by your health care provider. Make sure you discuss any questions you have with your health care provider.   Document Released: 10/11/2007 Document Revised: 05/15/2014 Document Reviewed: 08/26/2013 Elsevier Interactive Patient Education 2016 [employer] Inc.  Urinary Tract Infection Urinary tract infections (UTIs) can develop anywhere along your urinary tract. Your urinary tract is your body's drainage system for removing wastes and extra water. Your urinary tract includes two kidneys, two ureters, a bladder, and a urethra. Your kidneys are a pair of bean-shaped organs. Each kidney is about the size of your fist. They are located below your ribs, one on each side of your spine. CAUSES Infections are caused by microbes, which are microscopic organisms, including fungi, viruses, and bacteria. These organisms are so small that they can only be seen through a microscope. Bacteria are the microbes that most commonly cause UTIs. SYMPTOMS  Symptoms of UTIs may vary by age and gender of the patient and by the location of the infection. Symptoms in young women typically include a frequent and intense urge to urinate and a painful, burning feeling  in the bladder or urethra during urination. Older women and men are more likely to be tired, shaky, and weak and have muscle aches and abdominal pain. A fever may mean the infection is in your kidneys. Other symptoms of a kidney infection include pain in your back or sides below the ribs, nausea, and vomiting. DIAGNOSIS To diagnose a UTI, your caregiver will ask you about your symptoms. Your caregiver will also ask you to provide a urine sample. The urine sample will be tested for bacteria and white blood cells. White blood cells are made by your body to help fight infection. TREATMENT  Typically, UTIs can be  treated with medication. Because most UTIs are caused by a bacterial infection, they usually can be treated with the use of antibiotics. The choice of antibiotic and length of treatment depend on your symptoms and the type of bacteria causing your infection. HOME CARE INSTRUCTIONS  If you were prescribed antibiotics, take them exactly as your caregiver instructs you. Finish the medication even if you feel better after you have only taken some of the medication.  Drink enough water and fluids to keep your urine clear or pale yellow.  Avoid caffeine, tea, and carbonated beverages. They tend to irritate your bladder.  Empty your bladder often. Avoid holding urine for long periods of time.  Empty your bladder before and after sexual intercourse.  After a bowel movement, women should cleanse from front to back. Use each tissue only once. SEEK MEDICAL CARE IF:   You have back pain.  You develop a fever.  Your symptoms do not begin to resolve within 3 days. SEEK IMMEDIATE MEDICAL CARE IF:   You have severe back pain or lower abdominal pain.  You develop chills.  You have nausea or vomiting.  You have continued burning or discomfort with urination. MAKE SURE YOU:   Understand these instructions.  Will watch your condition.  Will get help right away if you are not doing well or get worse.   This information is not intended to replace advice given to you by your health care provider. Make sure you discuss any questions you have with your health care provider.   Document Released: 02/01/2005 Document Revised: 01/13/2015 Document Reviewed: 06/02/2011 Elsevier Interactive Patient Education [employer].

## 2015-04-26 NOTE — ED Provider Notes (Signed)
CSN: [id number]     Arrival date & time 04/26/15  [id number] History   First MD Initiated Contact with Patient 04/26/15 [phone]     Chief Complaint  Patient presents with  . Back Pain     HPI  She presents for evaluation of foul-smelling urine for several months, back pain for several weeks. She has history of scoliosis. She does have a relatively new job working at a [employer] where she is on her feet for hours on end. His pain her right lower back" right hip" muscles foul-smelling urine for the last several months. Does have occasional urinary frequency but not today. No vaginal bleeding or discharge. Denies symptoms of pregnancy. No flank pain. No fever.  Past Medical History  Diagnosis Date  . ADHD (attention deficit hyperactivity disorder)   . Scoliosis   . GERD (gastroesophageal reflux disease)   . Pregnant    Past Surgical History  Procedure Laterality Date  . Multiple tooth extractions     Family History  Problem Relation Age of Onset  . Adopted: Yes   Social History  Substance Use Topics  . Smoking status: Current Some Day Smoker  . Smokeless tobacco: None  . Alcohol Use: No   OB History    Gravida Para Term Preterm AB TAB SAB Ectopic Multiple Living   2 2 2       2      Review of Systems  Constitutional: Negative for fever, chills, diaphoresis, appetite change and fatigue.  HENT: Negative for mouth sores, sore throat and trouble swallowing.   Eyes: Negative for visual disturbance.  Respiratory: Negative for cough, chest tightness, shortness of breath and wheezing.   Cardiovascular: Negative for chest pain.  Gastrointestinal: Negative for nausea, vomiting, abdominal pain, diarrhea and abdominal distention.  Endocrine: Negative for polydipsia, polyphagia and polyuria.  Genitourinary: Negative for dysuria, frequency and hematuria.       Malodorous urine  Musculoskeletal: Positive for back pain. Negative for gait problem.  Skin: Negative for color change, pallor and  rash.  Neurological: Negative for dizziness, syncope, light-headedness and headaches.  Hematological: Does not bruise/bleed easily.  Psychiatric/Behavioral: Negative for behavioral problems and confusion.      Allergies  Review of patient's allergies indicates no known allergies.  Home Medications   Prior to Admission medications   Medication Sig Start Date End Date Taking? Authorizing Provider  guaifenesin (ROBITUSSIN) 100 MG/5ML syrup Take 200-400 mg by mouth 3 (three) times daily as needed for cough or congestion.   Yes Historical Provider, MD  ibuprofen (ADVIL,MOTRIN) 200 MG tablet Take 600 mg by mouth every 6 (six) hours as needed for moderate pain.   Yes Historical Provider, MD  Multiple Vitamin (MULTIVITAMIN WITH MINERALS) TABS tablet Take 1 tablet by mouth daily.   Yes Historical Provider, MD  methocarbamol (ROBAXIN) 500 MG tablet Take 1 tablet (500 mg total) by mouth 2 (two) times daily. 04/26/15   Rolland PorterMark Seymone Forlenza, MD  naproxen (NAPROSYN) 500 MG tablet Take 1 tablet (500 mg total) by mouth 2 (two) times daily. 04/26/15   Rolland PorterMark Scarlettrose Costilow, MD  sulfamethoxazole-trimethoprim (BACTRIM DS,SEPTRA DS) 800-160 MG tablet Take 1 tablet by mouth 2 (two) times daily. 04/26/15   Rolland PorterMark Susanna Benge, MD   BP 103/66 mmHg  Pulse 71  Temp(Src) 98.3 F (36.8 C) (Oral)  Resp 18  SpO2 100%  LMP 04/12/2015 Physical Exam  Constitutional: She is oriented to person, place, and time. She appears well-developed and well-nourished. No distress.  HENT:  Head: Normocephalic.  Eyes: Conjunctivae are normal. Pupils are equal, round, and reactive to light. No scleral icterus.  Neck: Normal range of motion. Neck supple. No thyromegaly present.  Cardiovascular: Normal rate and regular rhythm.  Exam reveals no gallop and no friction rub.   No murmur heard. Pulmonary/Chest: Effort normal and breath sounds normal. No respiratory distress. She has no wheezes. She has no rales.  Abdominal: Soft. Bowel sounds are normal. She  exhibits no distension. There is no tenderness. There is no rebound.  Musculoskeletal: Normal range of motion.       Back:  Neurological: She is alert and oriented to person, place, and time.  Skin: Skin is warm and dry. No rash noted.  Psychiatric: She has a normal mood and affect. Her behavior is normal.    ED Course  Procedures (including critical care time) Labs Review Labs Reviewed  URINALYSIS, ROUTINE W REFLEX MICROSCOPIC (NOT AT [facility]) - Abnormal; Notable for the following:    APPearance CLOUDY (*)    Leukocytes, UA SMALL (*)    All other components within normal limits  URINE MICROSCOPIC-ADD ON - Abnormal; Notable for the following:    Squamous Epithelial / LPF 0-5 (*)    Bacteria, UA MANY (*)    All other components within normal limits  POC URINE PREG, ED    Imaging Review No results found. I have personally reviewed and evaluated these images and lab results as part of my medical decision-making.   EKG Interpretation None      MDM   Final diagnoses:  UTI (lower urinary tract infection)  Right-sided low back pain without sciatica   Urine culture requested and pending. Urine doesn't suggest signs of infection. I do not think her back pain relates to her UTI. Plan has Bactrim for UTI. Robaxin and naproxen for her back pain. Given physical therapy referral. I think her back pain is probably SI dysfunction related to her scoliosis and her job with prolonged standing working at a [employer]. No neurological symptoms or findings. No red flag symptoms or findings. Appropriate for discharge home.    Rolland Porter, MD 04/26/15 [phone]

## 2015-05-06 ENCOUNTER — Encounter (HOSPITAL_COMMUNITY)
Admission: EM | Disposition: A | Discharge status: Home / Self Care | Payer: Self-pay | Source: Home / Self Care | Attending: Emergency Medicine

## 2015-05-06 ENCOUNTER — Emergency Department (HOSPITAL_COMMUNITY): Payer: Self-pay | Admitting: Anesthesiology

## 2015-05-06 ENCOUNTER — Emergency Department (HOSPITAL_COMMUNITY): Payer: Self-pay

## 2015-05-06 ENCOUNTER — Encounter (HOSPITAL_COMMUNITY): Payer: Self-pay | Admitting: Emergency Medicine

## 2015-05-06 ENCOUNTER — Emergency Department (HOSPITAL_COMMUNITY): Payer: Medicaid Other | Admitting: Anesthesiology

## 2015-05-06 ENCOUNTER — Observation Stay (HOSPITAL_COMMUNITY)
Admission: EM | Admit: 2015-05-06 | Discharge: 2015-05-07 | Disposition: A | Discharge status: Home / Self Care | Payer: Self-pay | Attending: General Surgery | Admitting: General Surgery

## 2015-05-06 DIAGNOSIS — K35891 Other acute appendicitis without perforation, with gangrene: Secondary | ICD-10-CM | POA: Diagnosis present

## 2015-05-06 DIAGNOSIS — F909 Attention-deficit hyperactivity disorder, unspecified type: Secondary | ICD-10-CM | POA: Insufficient documentation

## 2015-05-06 DIAGNOSIS — K3589 Other acute appendicitis: Principal | ICD-10-CM | POA: Insufficient documentation

## 2015-05-06 DIAGNOSIS — M419 Scoliosis, unspecified: Secondary | ICD-10-CM | POA: Insufficient documentation

## 2015-05-06 DIAGNOSIS — K219 Gastro-esophageal reflux disease without esophagitis: Secondary | ICD-10-CM | POA: Insufficient documentation

## 2015-05-06 DIAGNOSIS — F17210 Nicotine dependence, cigarettes, uncomplicated: Secondary | ICD-10-CM | POA: Insufficient documentation

## 2015-05-06 DIAGNOSIS — K3580 Unspecified acute appendicitis: Secondary | ICD-10-CM

## 2015-05-06 HISTORY — DX: Other acute appendicitis without perforation, with gangrene: K35.891

## 2015-05-06 HISTORY — PX: LAPAROSCOPIC APPENDECTOMY: SHX408

## 2015-05-06 LAB — URINALYSIS, ROUTINE W REFLEX MICROSCOPIC
Bilirubin Urine: NEGATIVE
GLUCOSE, UA: NEGATIVE mg/dL
Ketones, ur: >80 mg/dL — AB
Nitrite: NEGATIVE
PH: 7 (ref 5.0–8.0)
Protein, ur: NEGATIVE mg/dL
SPECIFIC GRAVITY, URINE: 1.026 (ref 1.005–1.030)

## 2015-05-06 LAB — LIPASE, BLOOD: LIPASE: 35 U/L (ref 11–51)

## 2015-05-06 LAB — URINE MICROSCOPIC-ADD ON

## 2015-05-06 LAB — COMPREHENSIVE METABOLIC PANEL
ALT: 15 U/L (ref 14–54)
ANION GAP: 13 (ref 5–15)
AST: 21 U/L (ref 15–41)
Albumin: 4.9 g/dL (ref 3.5–5.0)
Alkaline Phosphatase: 58 U/L (ref 38–126)
BUN: 11 mg/dL (ref 6–20)
CHLORIDE: 105 mmol/L (ref 101–111)
CO2: 22 mmol/L (ref 22–32)
Calcium: 9.7 mg/dL (ref 8.9–10.3)
Creatinine, Ser: 0.52 mg/dL (ref 0.44–1.00)
GFR calc Af Amer: >60 mL/min (ref >60)
Glucose, Bld: 124 mg/dL — ABNORMAL HIGH (ref 65–99)
Potassium: 4.6 mmol/L (ref 3.5–5.1)
Sodium: 140 mmol/L (ref 135–145)
Total Bilirubin: 0.6 mg/dL (ref 0.3–1.2)
Total Protein: 8.7 g/dL — ABNORMAL HIGH (ref 6.5–8.1)

## 2015-05-06 LAB — CBC
HCT: 41.5 % (ref 36.0–46.0)
Hemoglobin: 13.9 g/dL (ref 12.0–15.0)
MCH: 30.7 pg (ref 26.0–34.0)
MCHC: 33.5 g/dL (ref 30.0–36.0)
MCV: 91.6 fL (ref 78.0–100.0)
Platelets: 297 10*3/uL (ref 150–400)
RBC: 4.53 MIL/uL (ref 3.87–5.11)
RDW: 12.8 % (ref 11.5–15.5)
WBC: 20.5 10*3/uL — ABNORMAL HIGH (ref 4.0–10.5)

## 2015-05-06 LAB — I-STAT BETA HCG BLOOD, ED (MC, WL, AP ONLY): I-stat hCG, quantitative: <5 m[IU]/mL (ref <5)

## 2015-05-06 SURGERY — APPENDECTOMY, LAPAROSCOPIC
Anesthesia: General | Site: Abdomen

## 2015-05-06 MED ORDER — LACTATED RINGERS IV SOLN
INTRAVENOUS | Status: DC | PRN
  Administered 2015-05-06: 22:00:00 via INTRAVENOUS

## 2015-05-06 MED ORDER — GLYCOPYRROLATE 0.2 MG/ML IJ SOLN
INTRAMUSCULAR | Status: DC | PRN
  Administered 2015-05-06: 0.6 mg via INTRAVENOUS

## 2015-05-06 MED ORDER — IOHEXOL 300 MG/ML  SOLN
100.0000 mL | Freq: Once | INTRAMUSCULAR | Status: AC | PRN
  Administered 2015-05-06: 100 mL via INTRAVENOUS

## 2015-05-06 MED ORDER — DIPHENHYDRAMINE HCL 50 MG/ML IJ SOLN
INTRAMUSCULAR | Status: AC
  Filled 2015-05-06: qty 1

## 2015-05-06 MED ORDER — PROPOFOL 10 MG/ML IV BOLUS
INTRAVENOUS | Status: DC | PRN
  Administered 2015-05-06: 180 mg via INTRAVENOUS

## 2015-05-06 MED ORDER — 0.9 % SODIUM CHLORIDE (POUR BTL) OPTIME
TOPICAL | Status: DC | PRN
  Administered 2015-05-06: 1000 mL

## 2015-05-06 MED ORDER — ROCURONIUM BROMIDE 100 MG/10ML IV SOLN
INTRAVENOUS | Status: DC | PRN
  Administered 2015-05-06: 30 mg via INTRAVENOUS

## 2015-05-06 MED ORDER — MORPHINE SULFATE (PF) 4 MG/ML IV SOLN
4.0000 mg | Freq: Once | INTRAVENOUS | Status: AC
  Administered 2015-05-06: 4 mg via INTRAVENOUS
  Filled 2015-05-06: qty 1

## 2015-05-06 MED ORDER — HYDROMORPHONE HCL 1 MG/ML IJ SOLN: 0.2500 mg | INTRAMUSCULAR | Status: DC | PRN

## 2015-05-06 MED ORDER — SUCCINYLCHOLINE CHLORIDE 20 MG/ML IJ SOLN
INTRAMUSCULAR | Status: DC | PRN
  Administered 2015-05-06: 100 mg via INTRAVENOUS

## 2015-05-06 MED ORDER — NEOSTIGMINE METHYLSULFATE 10 MG/10ML IV SOLN
INTRAVENOUS | Status: AC
  Filled 2015-05-06: qty 1

## 2015-05-06 MED ORDER — ONDANSETRON HCL 4 MG/2ML IJ SOLN
INTRAMUSCULAR | Status: AC
  Filled 2015-05-06: qty 2

## 2015-05-06 MED ORDER — SODIUM CHLORIDE 0.9 % IV SOLN
Freq: Once | INTRAVENOUS | Status: AC
  Administered 2015-05-06: 21:00:00 via INTRAVENOUS

## 2015-05-06 MED ORDER — BUPIVACAINE-EPINEPHRINE 0.25% -1:200000 IJ SOLN
INTRAMUSCULAR | Status: DC | PRN
  Administered 2015-05-06: 13 mL

## 2015-05-06 MED ORDER — ROCURONIUM BROMIDE 100 MG/10ML IV SOLN
INTRAVENOUS | Status: AC
  Filled 2015-05-06: qty 1

## 2015-05-06 MED ORDER — PROPOFOL 10 MG/ML IV BOLUS
INTRAVENOUS | Status: AC
  Filled 2015-05-06: qty 20

## 2015-05-06 MED ORDER — CEFTRIAXONE SODIUM 2 G IJ SOLR
2.0000 g | Freq: Once | INTRAMUSCULAR | Status: AC
  Administered 2015-05-06: 2 g via INTRAVENOUS
  Filled 2015-05-06: qty 2

## 2015-05-06 MED ORDER — LIDOCAINE HCL (CARDIAC) 20 MG/ML IV SOLN
INTRAVENOUS | Status: DC | PRN
  Administered 2015-05-06: 80 mg via INTRATRACHEAL

## 2015-05-06 MED ORDER — FENTANYL CITRATE (PF) 250 MCG/5ML IJ SOLN
INTRAMUSCULAR | Status: DC | PRN
  Administered 2015-05-06: 50 ug via INTRAVENOUS
  Administered 2015-05-06 (×2): 100 ug via INTRAVENOUS

## 2015-05-06 MED ORDER — BUPIVACAINE-EPINEPHRINE (PF) 0.25% -1:200000 IJ SOLN
INTRAMUSCULAR | Status: AC
  Filled 2015-05-06: qty 30

## 2015-05-06 MED ORDER — SODIUM CHLORIDE 0.9 % IV BOLUS (SEPSIS)
1000.0000 mL | Freq: Once | INTRAVENOUS | Status: AC
  Administered 2015-05-06: 1000 mL via INTRAVENOUS

## 2015-05-06 MED ORDER — MIDAZOLAM HCL 2 MG/2ML IJ SOLN
INTRAMUSCULAR | Status: AC
  Filled 2015-05-06: qty 2

## 2015-05-06 MED ORDER — METRONIDAZOLE IN NACL 5-0.79 MG/ML-% IV SOLN
500.0000 mg | Freq: Once | INTRAVENOUS | Status: AC
  Administered 2015-05-06: 500 mg via INTRAVENOUS
  Filled 2015-05-06: qty 100

## 2015-05-06 MED ORDER — LACTATED RINGERS IR SOLN
Status: DC | PRN
  Administered 2015-05-06: 1000 mL

## 2015-05-06 MED ORDER — PROMETHAZINE HCL 25 MG/ML IJ SOLN: 6.2500 mg | INTRAMUSCULAR | Status: DC | PRN

## 2015-05-06 MED ORDER — NEOSTIGMINE METHYLSULFATE 10 MG/10ML IV SOLN
INTRAVENOUS | Status: DC | PRN
  Administered 2015-05-06: 4 mg via INTRAVENOUS

## 2015-05-06 MED ORDER — ONDANSETRON HCL 4 MG/2ML IJ SOLN
4.0000 mg | Freq: Once | INTRAMUSCULAR | Status: AC
  Administered 2015-05-06: 4 mg via INTRAVENOUS
  Filled 2015-05-06: qty 2

## 2015-05-06 MED ORDER — DEXAMETHASONE SODIUM PHOSPHATE 10 MG/ML IJ SOLN
INTRAMUSCULAR | Status: AC
  Filled 2015-05-06: qty 1

## 2015-05-06 MED ORDER — MIDAZOLAM HCL 5 MG/5ML IJ SOLN
INTRAMUSCULAR | Status: DC | PRN
  Administered 2015-05-06: 2 mg via INTRAVENOUS

## 2015-05-06 MED ORDER — IOHEXOL 300 MG/ML  SOLN
25.0000 mL | Freq: Once | INTRAMUSCULAR | Status: AC | PRN
  Administered 2015-05-06: 25 mL via ORAL

## 2015-05-06 MED ORDER — LIDOCAINE HCL (CARDIAC) 20 MG/ML IV SOLN
INTRAVENOUS | Status: AC
  Filled 2015-05-06: qty 5

## 2015-05-06 MED ORDER — FENTANYL CITRATE (PF) 250 MCG/5ML IJ SOLN
INTRAMUSCULAR | Status: AC
  Filled 2015-05-06: qty 5

## 2015-05-06 MED ORDER — DIPHENHYDRAMINE HCL 50 MG/ML IJ SOLN
INTRAMUSCULAR | Status: DC | PRN
  Administered 2015-05-06: 25 mg via INTRAVENOUS

## 2015-05-06 MED ORDER — GLYCOPYRROLATE 0.2 MG/ML IJ SOLN
INTRAMUSCULAR | Status: AC
  Filled 2015-05-06: qty 3

## 2015-05-06 MED ORDER — DEXAMETHASONE SODIUM PHOSPHATE 10 MG/ML IJ SOLN
INTRAMUSCULAR | Status: DC | PRN
  Administered 2015-05-06: 10 mg via INTRAVENOUS

## 2015-05-06 SURGICAL SUPPLY — 38 items
APPLIER CLIP 5 13 M/L LIGAMAX5 (MISCELLANEOUS)
APPLIER CLIP ROT 10 11.4 M/L (STAPLE)
CABLE HIGH FREQUENCY MONO STRZ (ELECTRODE) IMPLANT
CHLORAPREP W/TINT 26ML (MISCELLANEOUS) ×3 IMPLANT
CLIP APPLIE 5 13 M/L LIGAMAX5 (MISCELLANEOUS) IMPLANT
CLIP APPLIE ROT 10 11.4 M/L (STAPLE) IMPLANT
COVER SURGICAL LIGHT HANDLE (MISCELLANEOUS) IMPLANT
CUTTER FLEX LINEAR 45M (STAPLE) IMPLANT
DECANTER SPIKE VIAL GLASS SM (MISCELLANEOUS) ×3 IMPLANT
DRAPE LAPAROSCOPIC ABDOMINAL (DRAPES) ×3 IMPLANT
ELECT REM PT RETURN 9FT ADLT (ELECTROSURGICAL) ×3
ELECTRODE REM PT RTRN 9FT ADLT (ELECTROSURGICAL) ×1 IMPLANT
GLOVE BIOGEL PI IND STRL 7.0 (GLOVE) ×1 IMPLANT
GLOVE BIOGEL PI IND STRL 7.5 (GLOVE) ×1 IMPLANT
GLOVE BIOGEL PI INDICATOR 7.0 (GLOVE) ×2
GLOVE BIOGEL PI INDICATOR 7.5 (GLOVE) ×2
GLOVE ECLIPSE 7.5 STRL STRAW (GLOVE) ×3 IMPLANT
GLOVE SURG SS PI 6.5 STRL IVOR (GLOVE) ×3 IMPLANT
GLOVE SURG SS PI 8.5 STRL IVOR (GLOVE) ×4
GLOVE SURG SS PI 8.5 STRL STRW (GLOVE) ×2 IMPLANT
GOWN STRL REUS W/TWL 2XL LVL3 (GOWN DISPOSABLE) ×3 IMPLANT
GOWN STRL REUS W/TWL XL LVL3 (GOWN DISPOSABLE) ×6 IMPLANT
KIT BASIN OR (CUSTOM PROCEDURE TRAY) ×3 IMPLANT
LIQUID BAND (GAUZE/BANDAGES/DRESSINGS) IMPLANT
POUCH SPECIMEN RETRIEVAL 10MM (ENDOMECHANICALS) ×3 IMPLANT
RELOAD 45 VASCULAR/THIN (ENDOMECHANICALS) IMPLANT
RELOAD STAPLE TA45 3.5 REG BLU (ENDOMECHANICALS) IMPLANT
SCISSORS LAP 5X35 DISP (ENDOMECHANICALS) IMPLANT
SET IRRIG TUBING LAPAROSCOPIC (IRRIGATION / IRRIGATOR) ×3 IMPLANT
SHEARS HARMONIC ACE PLUS 36CM (ENDOMECHANICALS) ×3 IMPLANT
SLEEVE XCEL OPT CAN 5 100 (ENDOMECHANICALS) ×3 IMPLANT
SUT MNCRL AB 4-0 PS2 18 (SUTURE) ×3 IMPLANT
TOWEL OR 17X26 10 PK STRL BLUE (TOWEL DISPOSABLE) ×3 IMPLANT
TRAY FOLEY W/METER SILVER 14FR (SET/KITS/TRAYS/PACK) ×3 IMPLANT
TRAY FOLEY W/METER SILVER 16FR (SET/KITS/TRAYS/PACK) IMPLANT
TRAY LAPAROSCOPIC (CUSTOM PROCEDURE TRAY) ×3 IMPLANT
TROCAR BLADELESS OPT 5 100 (ENDOMECHANICALS) ×3 IMPLANT
TROCAR XCEL BLUNT TIP 100MML (ENDOMECHANICALS) ×3 IMPLANT

## 2015-05-06 NOTE — Anesthesia Procedure Notes (Signed)
Procedure Name: Intubation Date/Time: 05/06/2015 10:20 PM Performed by: [place], Rufus Cypert C Pre-anesthesia Checklist: Patient identified, Timeout performed, Emergency Drugs available, Suction available and Patient being monitored Patient Re-evaluated:Patient Re-evaluated prior to inductionOxygen Delivery Method: Circle system utilized Preoxygenation: Pre-oxygenation with 100% oxygen Intubation Type: IV induction, Cricoid Pressure applied and Rapid sequence Laryngoscope Size: Mac and 3 Grade View: Grade I Tube type: Oral Tube size: 7.0 mm Number of attempts: 1 Airway Equipment and Method: Stylet Placement Confirmation: ETT inserted through vocal cords under direct vision,  positive ETCO2,  CO2 detector and breath sounds checked- equal and bilateral Secured at: 20 cm Tube secured with: Tape Dental Injury: Teeth and Oropharynx as per pre-operative assessment

## 2015-05-06 NOTE — H&P (Signed)
Brandi Hoffman is an 24 y.o. female.    Chief Complaint: abdominal pain  HPI: patient is a generally healthy 24 year old female who awoke about 12 hours prior to admission with nausea  And generalized lower abdominal pain. She began vomiting and has had frequent nausea and vomiting today.  The pain has gradually intensified and is located a little more on the right side than the left in her lower abdomen. No particular  Exacerbating or alleviating factors. She has had some subjective fever. She was diagnosed with a urinary tract infection about 2 weeks ago and just completed a course of antibiotics. This was not associated with abdominal pain. She has not had any chronic GI complaints or similar symptoms. No current urinary symptoms. Normal menstrual period 4 weeks ago with no discharge.  Past Medical History  Diagnosis Date  . ADHD (attention deficit hyperactivity disorder)   . Scoliosis   . GERD (gastroesophageal reflux disease)   . Pregnant     Past Surgical History  Procedure Laterality Date  . Multiple tooth extractions      Family History  Problem Relation Age of Onset  . Adopted: Yes   Social History:  reports that she has been smoking.  She does not have any smokeless tobacco history on file. She reports that she does not drink alcohol or use illicit drugs.  Allergies: No Known Allergies   Current Facility-Administered Medications  Medication Dose Route Frequency Provider Last Rate Last Dose  . metroNIDAZOLE (FLAGYL) IVPB 500 mg  500 mg Intravenous Once Forde Dandy, MD       Current Outpatient Prescriptions  Medication Sig Dispense Refill  . Ascorbic Acid (VITAMIN C PO) Take 2 tablets by mouth daily.    Marland Kitchen guaifenesin (ROBITUSSIN) 100 MG/5ML syrup Take 200-400 mg by mouth 3 (three) times daily as needed for cough or congestion.    Marland Kitchen ibuprofen (ADVIL,MOTRIN) 200 MG tablet Take 600 mg by mouth every 6 (six) hours as needed for moderate pain.    . Multiple Vitamin  (MULTIVITAMIN WITH MINERALS) TABS tablet Take 1 tablet by mouth daily.    . methocarbamol (ROBAXIN) 500 MG tablet Take 1 tablet (500 mg total) by mouth 2 (two) times daily. 20 tablet 0  . naproxen (NAPROSYN) 500 MG tablet Take 1 tablet (500 mg total) by mouth 2 (two) times daily. 30 tablet 0  . sulfamethoxazole-trimethoprim (BACTRIM DS,SEPTRA DS) 800-160 MG tablet Take 1 tablet by mouth 2 (two) times daily. 10 tablet 0     Results for orders placed or performed during the hospital encounter of 05/06/15 (from the past 48 hour(s))  Lipase, blood     Status: None   Collection Time: 05/06/15  5:36 PM  Result Value Ref Range   Lipase 35 11 - 51 U/L  Comprehensive metabolic panel     Status: Abnormal   Collection Time: 05/06/15  5:36 PM  Result Value Ref Range   Sodium 140 135 - 145 mmol/L   Potassium 4.6 3.5 - 5.1 mmol/L   Chloride 105 101 - 111 mmol/L   CO2 22 22 - 32 mmol/L   Glucose, Bld 124 (H) 65 - 99 mg/dL   BUN 11 6 - 20 mg/dL   Creatinine, Ser 0.52 0.44 - 1.00 mg/dL   Calcium 9.7 8.9 - 10.3 mg/dL   Total Protein 8.7 (H) 6.5 - 8.1 g/dL   Albumin 4.9 3.5 - 5.0 g/dL   AST 21 15 - 41 U/L   ALT 15 14 -  54 U/L   Alkaline Phosphatase 58 38 - 126 U/L   Total Bilirubin 0.6 0.3 - 1.2 mg/dL   GFR calc non Af Amer >60 >60 mL/min   GFR calc Af Amer >60 >60 mL/min    Comment: (NOTE) The eGFR has been calculated using the CKD EPI equation. This calculation has not been validated in all clinical situations. eGFR's persistently <60 mL/min signify possible Chronic Kidney Disease.    Anion gap 13 5 - 15  CBC     Status: Abnormal   Collection Time: 05/06/15  5:36 PM  Result Value Ref Range   WBC 20.5 (H) 4.0 - 10.5 K/uL   RBC 4.53 3.87 - 5.11 MIL/uL   Hemoglobin 13.9 12.0 - 15.0 g/dL   HCT 41.5 36.0 - 46.0 %   MCV 91.6 78.0 - 100.0 fL   MCH 30.7 26.0 - 34.0 pg   MCHC 33.5 30.0 - 36.0 g/dL   RDW 12.8 11.5 - 15.5 %   Platelets 297 150 - 400 K/uL  I-Stat beta hCG blood, ED ([facility], [facility], [facility]  only)     Status: None   Collection Time: 05/06/15  5:41 PM  Result Value Ref Range   I-stat hCG, quantitative <5.0 <5 mIU/mL   Comment 3            Comment:   GEST. AGE      CONC.  (mIU/mL)   <=1 WEEK        5 - 50     2 WEEKS       50 - 500     3 WEEKS       100 - 10,000     4 WEEKS     1,000 - 30,000        FEMALE AND NON-PREGNANT FEMALE:     LESS THAN 5 mIU/mL   Urinalysis, Routine w reflex microscopic (not at [facility])     Status: Abnormal   Collection Time: 05/06/15  6:02 PM  Result Value Ref Range   Color, Urine YELLOW YELLOW   APPearance CLOUDY (A) CLEAR   Specific Gravity, Urine 1.026 1.005 - 1.030   pH 7.0 5.0 - 8.0   Glucose, UA NEGATIVE NEGATIVE mg/dL   Hgb urine dipstick TRACE (A) NEGATIVE   Bilirubin Urine NEGATIVE NEGATIVE   Ketones, ur >80 (A) NEGATIVE mg/dL   Protein, ur NEGATIVE NEGATIVE mg/dL   Nitrite NEGATIVE NEGATIVE   Leukocytes, UA SMALL (A) NEGATIVE  Urine microscopic-add on     Status: Abnormal   Collection Time: 05/06/15  6:02 PM  Result Value Ref Range   Squamous Epithelial / LPF 0-5 (A) NONE SEEN   WBC, UA 6-30 0 - 5 WBC/hpf   RBC / HPF 6-30 0 - 5 RBC/hpf   Bacteria, UA MANY (A) NONE SEEN   Urine-Other MUCOUS PRESENT    Ct Abdomen Pelvis W Contrast  05/06/2015  CLINICAL DATA:  Abdominal pain, vomiting and diarrhea beginning today. Pain right lower quadrant. EXAM: CT ABDOMEN AND PELVIS WITH CONTRAST TECHNIQUE: Multidetector CT imaging of the abdomen and pelvis was performed using the standard protocol following bolus administration of intravenous contrast. CONTRAST:  64m OMNIPAQUE IOHEXOL 300 MG/ML SOLN, 1023mOMNIPAQUE IOHEXOL 300 MG/ML SOLN COMPARISON:  None. FINDINGS: Lung bases are within normal. Abdominal images demonstrate moderate cholelithiasis in the form of multiple cholesterol stones. There is mild layering calcification along the dependent portion of the gallbladder. Subtle somewhat linear hypodensity over the right lobe of the liver. The  spleen, pancreas, adrenal glands and stomach are normal. Kidneys are normal. Vascular structures are within normal.  Mesentery is within normal. The cecum extends into the right pelvis as the appendix courses medially across the midline located just superior to the bladder and anterior to the uterus. There is a 7 mm appendicolith present over the proximal segment of the appendix. The appendix is enlarged measuring 11-12 mm in diameter with mild mucosal enhancement and minimal associated stranding of the adjacent fat. Findings are likely due to mild acute appendicitis. No evidence of perforation or abscess. Small amount of free fluid in the right pelvis. Pelvic images demonstrate the bladder, uterus, ovaries and rectum to be within normal. Remaining bones and soft tissues are within normal. IMPRESSION: Findings likely representing mild acute appendicitis. No evidence of perforation or abscess. Note the location of the appendix is in the pelvis where it crosses left of midline and lies just superior to the bladder and anterior to the uterus. Moderate cholelithiasis. Subtle hypodensity over the dome of the right lobe of the liver somewhat linear and of uncertain clinical significance but likely a benign or artifactual finding. Recommend follow-up CT 6 months. These results were called by telephone at the time of interpretation on 05/06/2015 at 8:52 pm to Dr. Brantley Stage , who verbally acknowledged these results. Electronically Signed   By: Marin Olp M.D.   On: 05/06/2015 20:53    Review of Systems  Constitutional: Positive for fever and malaise/fatigue. Negative for chills.  Respiratory: Negative.   Cardiovascular: Negative.   Gastrointestinal: Positive for nausea, vomiting and abdominal pain. Negative for diarrhea, constipation and blood in stool.  Genitourinary: Negative.     Blood pressure 96/75, pulse 120, temperature 98.2 F (36.8 C), temperature source Oral, resp. rate 17, last menstrual period  04/12/2015, SpO2 100 %, not currently breastfeeding. Physical Exam  General: Alert, well-developed Caucasian female, appears uncomfortable Skin: Warm and dry without rash or infection. HEENT: No palpable masses or thyromegaly. Sclera nonicteric. Pupils equal round and reactive. Oropharynx clear. Lymph nodes: No cervical, supraclavicular, or inguinal nodes palpable. Lungs: Breath sounds clear and equal without increased work of breathing Cardiovascular: Regular rate and rhythm without murmur. No JVD or edema. Peripheral pulses intact. Abdomen: Nondistended. Bowel sounds present and hypoactive There is generalized lower abdominal tenderness but most marked in the right lower quadrant with guarding. No palpable masses. No hernias. Extremities: No edema or joint swelling or deformity. No chronic venous stasis changes. Neurologic: Alert and fully oriented. Normal for content. No gross motor deficits.   .Assessment/Plan Acute abdominal pain elevated white count and history and physical exam  consistent with acute appendicitis. This has been confirmed on CT scan. She has received broad-spectrum IV antibiotics and IV fluids. I have recommended proceeding with laparoscopic appendectomy. We discussed the indications for the surgery and its nature, expected recovery and alternatives. I discussed risks including anesthetic complications, bleeding, infection or possible need for open surgery.She understands and agrees to proceed.  Mukund Weinreb T 05/06/2015, 9:38 PM

## 2015-05-06 NOTE — Op Note (Signed)
Preoperative Diagnosis:  appendicitis  Postoprative Diagnosis:  Appendicitis, with gangrene, no perforation or abscess  Procedure: Procedure(s): APPENDECTOMY LAPAROSCOPIC   Surgeon: Glenna FellowsHoxworth, Nyellie Yetter T   Assistants: none  Anesthesia:  General endotracheal anesthesia  Indications: patient is a 24 year old female who presents with 24 hours of lower abdominal pain with physical findings and lab work consistent with acute appendicitis, confirmed on CT scan. After discussion regarding the alternatives and surgery and risks detailed elsewhere we have elected to proceed with laparoscopic appendectomy.    Procedure Detail:  Patient was taken to the operating room, placed in the supine position on the operating table, and general endotracheal anesthesia induced. Foley catheter was placed. She had been given preoperative broad-spectrum IV antibiotics. PAS replaced. The abdomen was widely sterilely prepped and draped. Patient timeout was performed and correct procedure verified. Access was obtained with a 1.5 cm incision at the umbilicus with an open Hassan technique through a mattress suture of 0 Vicryl and pneumoperitoneum established. Under direct vision 5 mm trochars were placed in the right upper quadrant and the left lower quadrant. The appendix was found lying in the mid lower pelvis and had gangrenous changes but no perforation. The appendix was elevated and was quite mobile with a relatively noninflamed base. The mesoappendix was divided sequentially with the Harmonic scalpel until the appendix was completely freed down to the tip of the cecum. The appendix was then divided across the tip of the cecum with a single firing of the blue load Endo GIA stapler. The staple line was intact and without bleeding. The right lower quadrant was irrigated and no bleeding or other problems seen. The appendix was placed in an Endo Catch bag and brought out through the umbilical incision. The mattress suture was  secured and skin incisions were closed with subcuticular Monocryl and Dermabond. Sponge needle and instrument counts were correct.    Findings: Acute appendicitis with early gangrenous changes but no perforation  Estimated Blood Loss:  Minimal         Drains: none  Blood Given: none          Specimens: appendix        Complications:  * No complications entered in OR log *         Disposition: PACU - hemodynamically stable.         Condition: stable

## 2015-05-06 NOTE — ED Provider Notes (Signed)
CSN: [id number]     Arrival date & time 05/06/15  1709 History   First MD Initiated Contact with Patient 05/06/15 1852     Chief Complaint  Patient presents with  . Abdominal Pain  . Emesis     (Consider location/radiation/quality/duration/timing/severity/associated sxs/prior Treatment) HPI 24 year old female who presents with N/V and abdominal pain.  No prior abdominal surgeries. She went to bed in her usual state of health yesterday evening. States that she woke up this morning with low abdominal pain, nausea and vomiting. Associated with infrequent episodes of diarrhea. Has not tolerated anything by mouth today. States that she recently finished antibiotic several days ago for kidney infection, but not having any back pain or symptoms that would be concerning for urinary tract infection she states. States that her prior symptoms of kidney infection fully resolved before onset of symptoms today. No vaginal bleeding or vaginal discharge, dysuria, urinary frequency, flank pain, hematuria. No fevers, but having chills Past Medical History  Diagnosis Date  . ADHD (attention deficit hyperactivity disorder)   . Scoliosis   . GERD (gastroesophageal reflux disease)   . Pregnant    Past Surgical History  Procedure Laterality Date  . Multiple tooth extractions     Family History  Problem Relation Age of Onset  . Adopted: Yes   Social History  Substance Use Topics  . Smoking status: Current Some Day Smoker  . Smokeless tobacco: None  . Alcohol Use: No   OB History    Gravida Para Term Preterm AB TAB SAB Ectopic Multiple Living   Review of Systems 10/14 systems reviewed and are negative other than those stated in the HPI   Allergies  Review of patient's allergies indicates no known allergies.  Home Medications   Prior to Admission medications   Medication Sig Start Date End Date Taking? Authorizing Provider  Ascorbic Acid (VITAMIN C PO) Take 2 tablets by  mouth daily.   Yes Historical Provider, MD  guaifenesin (ROBITUSSIN) 100 MG/5ML syrup Take 200-400 mg by mouth 3 (three) times daily as needed for cough or congestion.   Yes Historical Provider, MD  ibuprofen (ADVIL,MOTRIN) 200 MG tablet Take 600 mg by mouth every 6 (six) hours as needed for moderate pain.   Yes Historical Provider, MD  Multiple Vitamin (MULTIVITAMIN WITH MINERALS) TABS tablet Take 1 tablet by mouth daily.   Yes Historical Provider, MD  methocarbamol (ROBAXIN) 500 MG tablet Take 1 tablet (500 mg total) by mouth 2 (two) times daily. 04/26/15   Rolland Porter, MD  naproxen (NAPROSYN) 500 MG tablet Take 1 tablet (500 mg total) by mouth 2 (two) times daily. 04/26/15   Rolland Porter, MD  sulfamethoxazole-trimethoprim (BACTRIM DS,SEPTRA DS) 800-160 MG tablet Take 1 tablet by mouth 2 (two) times daily. 04/26/15   Rolland Porter, MD   BP 102/61 mmHg  Pulse 106  Temp(Src) 99.7 F (37.6 C) (Oral)  Resp 14  SpO2 100%  LMP 04/12/2015 Physical Exam Physical Exam  Nursing note and vitals reviewed. Constitutional: Well developed, well nourished, non-toxic, and in no acute distress, tearful and anxious appearing Head: Normocephalic and atraumatic.  Mouth/Throat: Oropharynx is clear. Mucous membranes dry.  Neck: Normal range of motion. Neck supple.  Cardiovascular: Tachycardic rate and regular rhythm.  No edema Pulmonary/Chest: Effort normal and breath sounds normal.  Abdominal: Soft. No distension. Tender to palpation with guarding in low abdomen (reported right/suprapubic greater than left).Marland Kitchen  There is no rebound. No CVA tenderness.  Musculoskeletal: Normal range of motion.  Neurological: Alert, no facial droop, fluent speech, moves all extremities symmetrically Skin: Skin is warm and dry.  Psychiatric: Cooperative  ED Course  Procedures (including critical care time) Labs Review Labs Reviewed  COMPREHENSIVE METABOLIC PANEL - Abnormal; Notable for the following:    Glucose, Bld 124 (*)     Total Protein 8.7 (*)    All other components within normal limits  CBC - Abnormal; Notable for the following:    WBC 20.5 (*)    All other components within normal limits  URINALYSIS, ROUTINE W REFLEX MICROSCOPIC (NOT AT [facility]) - Abnormal; Notable for the following:    APPearance CLOUDY (*)    Hgb urine dipstick TRACE (*)    Ketones, ur >80 (*)    Leukocytes, UA SMALL (*)    All other components within normal limits  URINE MICROSCOPIC-ADD ON - Abnormal; Notable for the following:    Squamous Epithelial / LPF 0-5 (*)    Bacteria, UA MANY (*)    All other components within normal limits  LIPASE, BLOOD  CBC  I-STAT BETA HCG BLOOD, ED ([facility], [facility], [facility] ONLY)  SURGICAL PATHOLOGY    Imaging Review Ct Abdomen Pelvis W Contrast  05/06/2015  CLINICAL DATA:  Abdominal pain, vomiting and diarrhea beginning today. Pain right lower quadrant. EXAM: CT ABDOMEN AND PELVIS WITH CONTRAST TECHNIQUE: Multidetector CT imaging of the abdomen and pelvis was performed using the standard protocol following bolus administration of intravenous contrast. CONTRAST:  25mL OMNIPAQUE IOHEXOL 300 MG/ML SOLN, 100mL OMNIPAQUE IOHEXOL 300 MG/ML SOLN COMPARISON:  None. FINDINGS: Lung bases are within normal. Abdominal images demonstrate moderate cholelithiasis in the form of multiple cholesterol stones. There is mild layering calcification along the dependent portion of the gallbladder. Subtle somewhat linear hypodensity over the right lobe of the liver. The spleen, pancreas, adrenal glands and stomach are normal. Kidneys are normal. Vascular structures are within normal.  Mesentery is within normal. The cecum extends into the right pelvis as the appendix courses medially across the midline located just superior to the bladder and anterior to the uterus. There is a 7 mm appendicolith present over the proximal segment of the appendix. The appendix is enlarged measuring 11-12 mm in diameter with mild mucosal enhancement and minimal  associated stranding of the adjacent fat. Findings are likely due to mild acute appendicitis. No evidence of perforation or abscess. Small amount of free fluid in the right pelvis. Pelvic images demonstrate the bladder, uterus, ovaries and rectum to be within normal. Remaining bones and soft tissues are within normal. IMPRESSION: Findings likely representing mild acute appendicitis. No evidence of perforation or abscess. Note the location of the appendix is in the pelvis where it crosses left of midline and lies just superior to the bladder and anterior to the uterus. Moderate cholelithiasis. Subtle hypodensity over the dome of the right lobe of the liver somewhat linear and of uncertain clinical significance but likely a benign or artifactual finding. Recommend follow-up CT 6 months. These results were called by telephone at the time of interpretation on 05/06/2015 at 8:52 pm to Dr. Crista CurbANA LIU , who verbally acknowledged these results. Electronically Signed   By: Elberta Fortisaniel  Boyle M.D.   On: 05/06/2015 20:53   I have personally reviewed and evaluated these images and lab results as part of my medical decision-making.   MDM   Final diagnoses:  Acute appendicitis, unspecified acute appendicitis type  24 year old female, otherwise healthy, who presents with one day of low abdominal pain, nausea and vomiting.  Afebrile and hemodynamically stable. Has soft abdomen with low abdominal tenderness right greater than left. Blood work with leukocytosis of 22. C/f appendicitis and CT performed, visualized, and confirms acute uncomplicated appendicitis. Remains NPO. Received IVF, ceftriaxone and flagyl. Discussed with Dr. Johna Sheriff who will admit for operative management.     Lavera Guise, MD 05/07/15 [phone]

## 2015-05-06 NOTE — ED Notes (Signed)
Pt c/o abdominal pain, emesis, diarrhea onset today.

## 2015-05-06 NOTE — Transfer of Care (Signed)
Immediate Anesthesia Transfer of Care Note  Patient: Brandi Hoffman  Procedure(s) Performed: Procedure(s): APPENDECTOMY LAPAROSCOPIC (N/A)  Patient Location: PACU  Anesthesia Type:General  Level of Consciousness: awake, alert  and oriented  Airway & Oxygen Therapy: Patient Spontanous Breathing and Patient connected to face mask oxygen  Post-op Assessment: Report given to RN and Post -op Vital signs reviewed and stable  Post vital signs: Reviewed and stable  Last Vitals:  Filed Vitals:   05/06/15 2146 05/06/15 2320  BP: 100/85 115/50  Pulse: 115 143  Temp:  36.9 C  Resp: 16 20    Complications: No apparent anesthesia complications

## 2015-05-06 NOTE — Anesthesia Preprocedure Evaluation (Signed)
Anesthesia Evaluation  Patient identified by MRN, date of birth, ID band Patient awake    Reviewed: Allergy & Precautions, NPO status , Patient's Chart, lab work & pertinent test results  Airway Mallampati: II  TM Distance: >3 FB Neck ROM: Full    Dental no notable dental hx. (+) Dental Advisory Given   Pulmonary Current Smoker,    Pulmonary exam normal        Cardiovascular negative cardio ROS Normal cardiovascular exam     Neuro/Psych negative neurological ROS  negative psych ROS   GI/Hepatic Neg liver ROS, GERD  ,  Endo/Other  negative endocrine ROS  Renal/GU negative Renal ROS  negative genitourinary   Musculoskeletal negative musculoskeletal ROS (+)   Abdominal   Peds negative pediatric ROS (+)  Hematology negative hematology ROS (+)   Anesthesia Other Findings   Reproductive/Obstetrics negative OB ROS                             Anesthesia Physical Anesthesia Plan  ASA: II and emergent  Anesthesia Plan: General   Post-op Pain Management:    Induction: Intravenous, Rapid sequence and Cricoid pressure planned  Airway Management Planned: Oral ETT  Additional Equipment:   Intra-op Plan:   Post-operative Plan: Extubation in OR  Informed Consent: I have reviewed the patients History and Physical, chart, labs and discussed the procedure including the risks, benefits and alternatives for the proposed anesthesia with the patient or authorized representative who has indicated his/her understanding and acceptance.   Dental advisory given  Plan Discussed with: CRNA, Anesthesiologist and Surgeon  Anesthesia Plan Comments:         Anesthesia Quick Evaluation

## 2015-05-06 NOTE — ED Notes (Signed)
Patient transported to OR.

## 2015-05-06 NOTE — ED Notes (Signed)
Patient transported to CT 

## 2015-05-07 ENCOUNTER — Encounter (HOSPITAL_COMMUNITY): Payer: Self-pay | Admitting: General Surgery

## 2015-05-07 LAB — CBC
HCT: 40 % (ref 36.0–46.0)
HEMOGLOBIN: 12.9 g/dL (ref 12.0–15.0)
MCH: 29.9 pg (ref 26.0–34.0)
MCHC: 32.3 g/dL (ref 30.0–36.0)
MCV: 92.6 fL (ref 78.0–100.0)
Platelets: 289 10*3/uL (ref 150–400)
RBC: 4.32 MIL/uL (ref 3.87–5.11)
RDW: 13 % (ref 11.5–15.5)
WBC: 16.8 10*3/uL — AB (ref 4.0–10.5)

## 2015-05-07 MED ORDER — ONDANSETRON 4 MG PO TBDP: 4.0000 mg | ORAL_TABLET | Freq: Four times a day (QID) | ORAL | Status: DC | PRN

## 2015-05-07 MED ORDER — ONDANSETRON HCL 4 MG/2ML IJ SOLN: 4.0000 mg | Freq: Four times a day (QID) | INTRAMUSCULAR | Status: DC | PRN

## 2015-05-07 MED ORDER — ENOXAPARIN SODIUM 40 MG/0.4ML ~~LOC~~ SOLN
40.0000 mg | SUBCUTANEOUS | Status: DC
  Filled 2015-05-07: qty 0.4

## 2015-05-07 MED ORDER — MORPHINE SULFATE (PF) 2 MG/ML IV SOLN
2.0000 mg | INTRAVENOUS | Status: DC | PRN
  Administered 2015-05-07: 4 mg via INTRAVENOUS
  Administered 2015-05-07: 2 mg via INTRAVENOUS
  Filled 2015-05-07: qty 1
  Filled 2015-05-07: qty 2

## 2015-05-07 MED ORDER — HYDROCODONE-ACETAMINOPHEN 5-325 MG PO TABS: 1.0000 | ORAL_TABLET | ORAL | Status: DC | PRN

## 2015-05-07 MED ORDER — CEFTRIAXONE SODIUM 2 G IJ SOLR
2.0000 g | Freq: Once | INTRAMUSCULAR | Status: AC
  Administered 2015-05-07: 2 g via INTRAVENOUS
  Filled 2015-05-07: qty 2

## 2015-05-07 MED ORDER — DEXTROSE IN LACTATED RINGERS 5 % IV SOLN
INTRAVENOUS | Status: DC
  Administered 2015-05-07: 01:00:00 via INTRAVENOUS

## 2015-05-07 MED ORDER — HYDROCODONE-ACETAMINOPHEN 5-325 MG PO TABS
1.0000 | ORAL_TABLET | ORAL | Status: DC | PRN
  Administered 2015-05-07: 1 via ORAL
  Filled 2015-05-07: qty 1

## 2015-05-07 MED ORDER — METRONIDAZOLE IN NACL 5-0.79 MG/ML-% IV SOLN
500.0000 mg | Freq: Three times a day (TID) | INTRAVENOUS | Status: DC
  Administered 2015-05-07: 500 mg via INTRAVENOUS
  Filled 2015-05-07 (×2): qty 100

## 2015-05-07 MED ORDER — CIPROFLOXACIN HCL 500 MG PO TABS: 500.0000 mg | ORAL_TABLET | Freq: Two times a day (BID) | ORAL | Status: DC

## 2015-05-07 NOTE — Progress Notes (Signed)
Pt to d/c home. AVS reviewed and "My Chart" discussed with pt. Pt capable of verbalizing medications, dressing changes, signs and symptoms of infection, and follow-up appointments. Remains hemodynamically stable. No signs and symptoms of distress. Educated pt to return to ER in the case of SOB, dizziness, or chest pain.  

## 2015-05-07 NOTE — Discharge Summary (Signed)
Physician Discharge Summary  Patient ID:  Brandi Hoffman  MRN: [id number]  DOB/AGE: 01/02/1991 24 y.o.  Admit date: 05/06/2015 Discharge date: 05/07/2015  Discharge Diagnoses:   Active Problems:   Acute gangrenous appendicitis  Operation: Procedure(s): APPENDECTOMY LAPAROSCOPIC on 05/06/2015 - B. Hoxworth  Discharged Condition: good  Hospital Course: Brandi Hoffman is an 24 y.o. female whose primary care physician is No PCP Per Patient and who was admitted 05/06/2015 with a chief complaint of  Chief Complaint  Patient presents with  . Abdominal Pain  . Emesis  .   She was brought to the operating room on 05/06/2015 - 05/07/2015 and underwent  APPENDECTOMY LAPAROSCOPIC.  She has had some oozing from her umbilcal incision, but this is controlled with a pressure dressing. Her boyfriend, Verl DickerCaleb Wilson, is in the room with her. She had a gangrenous appendix, so Dr. Johna SheriffHoxworth thought that 5 more days of antibiotics were be appropriate.  The discharge instructions were reviewed with the patient.  Consults: None  Significant Diagnostic Studies: Results for orders placed or performed during the hospital encounter of 05/06/15  Lipase, blood  Result Value Ref Range   Lipase 35 11 - 51 U/L  Comprehensive metabolic panel  Result Value Ref Range   Sodium 140 135 - 145 mmol/L   Potassium 4.6 3.5 - 5.1 mmol/L   Chloride 105 101 - 111 mmol/L   CO2 22 22 - 32 mmol/L   Glucose, Bld 124 (H) 65 - 99 mg/dL   BUN 11 6 - 20 mg/dL   Creatinine, Ser 3.290.52 0.44 - 1.00 mg/dL   Calcium 9.7 8.9 - 51.810.3 mg/dL   Total Protein 8.7 (H) 6.5 - 8.1 g/dL   Albumin 4.9 3.5 - 5.0 g/dL   AST 21 15 - 41 U/L   ALT 15 14 - 54 U/L   Alkaline Phosphatase 58 38 - 126 U/L   Total Bilirubin 0.6 0.3 - 1.2 mg/dL   GFR calc non Af Amer >60 >60 mL/min   GFR calc Af Amer >60 >60 mL/min   Anion gap 13 5 - 15  CBC  Result Value Ref Range   WBC 20.5 (H) 4.0 - 10.5 K/uL   RBC 4.53 3.87 - 5.11 MIL/uL   Hemoglobin  13.9 12.0 - 15.0 g/dL   HCT 84.141.5 66.036.0 - 63.046.0 %   MCV 91.6 78.0 - 100.0 fL   MCH 30.7 26.0 - 34.0 pg   MCHC 33.5 30.0 - 36.0 g/dL   RDW 16.012.8 10.911.5 - 32.315.5 %   Platelets 297 150 - 400 K/uL  Urinalysis, Routine w reflex microscopic (not at [facility])  Result Value Ref Range   Color, Urine YELLOW YELLOW   APPearance CLOUDY (A) CLEAR   Specific Gravity, Urine 1.026 1.005 - 1.030   pH 7.0 5.0 - 8.0   Glucose, UA NEGATIVE NEGATIVE mg/dL   Hgb urine dipstick TRACE (A) NEGATIVE   Bilirubin Urine NEGATIVE NEGATIVE   Ketones, ur >80 (A) NEGATIVE mg/dL   Protein, ur NEGATIVE NEGATIVE mg/dL   Nitrite NEGATIVE NEGATIVE   Leukocytes, UA SMALL (A) NEGATIVE  Urine microscopic-add on  Result Value Ref Range   Squamous Epithelial / LPF 0-5 (A) NONE SEEN   WBC, UA 6-30 0 - 5 WBC/hpf   RBC / HPF 6-30 0 - 5 RBC/hpf   Bacteria, UA MANY (A) NONE SEEN   Urine-Other MUCOUS PRESENT   CBC  Result Value Ref Range   WBC 16.8 (H) 4.0 - 10.5 K/uL   RBC  4.32 3.87 - 5.11 MIL/uL   Hemoglobin 12.9 12.0 - 15.0 g/dL   HCT 16.1 09.6 - 04.5 %   MCV 92.6 78.0 - 100.0 fL   MCH 29.9 26.0 - 34.0 pg   MCHC 32.3 30.0 - 36.0 g/dL   RDW 40.9 81.1 - 91.4 %   Platelets 289 150 - 400 K/uL  I-Stat beta hCG blood, ED ([facility], [facility], [facility] only)  Result Value Ref Range   I-stat hCG, quantitative <5.0 <5 mIU/mL   Comment 3            Ct Abdomen Pelvis W Contrast  05/06/2015  CLINICAL DATA:  Abdominal pain, vomiting and diarrhea beginning today. Pain right lower quadrant. EXAM: CT ABDOMEN AND PELVIS WITH CONTRAST TECHNIQUE: Multidetector CT imaging of the abdomen and pelvis was performed using the standard protocol following bolus administration of intravenous contrast. CONTRAST:  25mL OMNIPAQUE IOHEXOL 300 MG/ML SOLN, OMNIPAQUE IOHEXOL 300 MG/ML SOLN COMPARISON:  None. FINDINGS: Lung bases are within normal. Abdominal images demonstrate moderate cholelithiasis in the form of multiple cholesterol stones. There is mild layering  calcification along the dependent portion of the gallbladder. Subtle somewhat linear hypodensity over the right lobe of the liver. The spleen, pancreas, adrenal glands and stomach are normal. Kidneys are normal. Vascular structures are within normal.  Mesentery is within normal. The cecum extends into the right pelvis as the appendix courses medially across the midline located just superior to the bladder and anterior to the uterus. There is a 7 mm appendicolith present over the proximal segment of the appendix. The appendix is enlarged measuring 11-12 mm in diameter with mild mucosal enhancement and minimal associated stranding of the adjacent fat. Findings are likely due to mild acute appendicitis. No evidence of perforation or abscess. Small amount of free fluid in the right pelvis. Pelvic images demonstrate the bladder, uterus, ovaries and rectum to be within normal. Remaining bones and soft tissues are within normal. IMPRESSION: Findings likely representing mild acute appendicitis. No evidence of perforation or abscess. Note the location of the appendix is in the pelvis where it crosses left of midline and lies just superior to the bladder and anterior to the uterus. Moderate cholelithiasis. Subtle hypodensity over the dome of the right lobe of the liver somewhat linear and of uncertain clinical significance but likely a benign or artifactual finding. Recommend follow-up CT 6 months. These results were called by telephone at the time of interpretation on 05/06/2015 at 8:52 pm to Dr. Crista Curb , who verbally acknowledged these results. Electronically Signed   By: Elberta Fortis M.D.   On: 05/06/2015 20:53    Discharge Exam:  Filed Vitals:   05/07/15 0323 05/07/15 0430  BP: 108/63 103/54  Pulse:    Temp: 99.7 F (37.6 C) 99.2 F (37.3 C)  Resp: 16 16    General: WN WF who is alert. Lungs: Clear to auscultation and symmetric breath sounds. Heart:  RRR. No murmur or rub. Abdomen: Soft. No mass.  No  hernia. She has some tenderness in the right lower quadrant.  She has some oozing from her umbilical incision.  Discharge Medications:     Medication List    STOP taking these medications        sulfamethoxazole-trimethoprim 800-160 MG tablet  Commonly known as:  BACTRIM DS,SEPTRA DS      TAKE these medications        ciprofloxacin 500 MG tablet  Commonly known as:  CIPRO  Take 1 tablet (500  mg total) by mouth 2 (two) times daily.     guaifenesin 100 MG/5ML syrup  Commonly known as:  ROBITUSSIN  Take 200-400 mg by mouth 3 (three) times daily as needed for cough or congestion.     HYDROcodone-acetaminophen 5-325 MG tablet  Commonly known as:  NORCO/VICODIN  Take 1-2 tablets by mouth every 4 (four) hours as needed for moderate pain.     ibuprofen 200 MG tablet  Commonly known as:  ADVIL,MOTRIN  Take 600 mg by mouth every 6 (six) hours as needed for moderate pain.     methocarbamol 500 MG tablet  Commonly known as:  ROBAXIN  Take 1 tablet (500 mg total) by mouth 2 (two) times daily.     multivitamin with minerals Tabs tablet  Take 1 tablet by mouth daily.     naproxen 500 MG tablet  Commonly known as:  NAPROSYN  Take 1 tablet (500 mg total) by mouth 2 (two) times daily.     VITAMIN C PO  Take 2 tablets by mouth daily.        Disposition: 01-Home or Self Care      Discharge Instructions    Diet - low sodium heart healthy    Complete by:  As directed      Increase activity slowly    Complete by:  As directed            Follow-up Information    Follow up with LIEPINS, ANDY, PA-C On 05/26/2015.   Specialty:  Surgery   Why:  [facility], 11:00am, arrive no later than 10:30am for paperwork and check in   Contact information:   [address] [place] [address] [phone]      Return to work on:  05/16/2014  Activity:  Driving - may drive in 3 or 4 days, if doing well   Lifting - No lifting more than 15 pounds for 1 week  Wound  Care:   May shower starting tomorrow  Diet:  As tolerated  Follow up appointment:  Call Dr. Jamse Mead office [facility]) at [phone] for an appointment in 2 to 3 weeks  Medications and dosages:  Resume your home medications.  You have a prescription for:  Vicodin and Cipro  Signed: Ovidio Kin, M.D., [facility] Office:  [phone]  05/07/2015, 8:59 AM

## 2015-05-07 NOTE — Anesthesia Postprocedure Evaluation (Signed)
Anesthesia Post Note  Patient: Brandi Hoffman  Procedure(s) Performed: Procedure(s) (LRB): APPENDECTOMY LAPAROSCOPIC (N/A)  Patient location during evaluation: PACU Anesthesia Type: General Level of consciousness: sedated Pain management: pain level controlled Vital Signs Assessment: post-procedure vital signs reviewed and stable Respiratory status: spontaneous breathing and respiratory function stable Cardiovascular status: stable Anesthetic complications: no    [name] DANIEL

## 2015-05-07 NOTE — Discharge Instructions (Signed)
[facility] ______[facility] °LAPAROSCOPIC SURGERY: POST OP INSTRUCTIONS °Always review your discharge instruction sheet given to you by the facility where your surgery was performed. °IF YOU HAVE DISABILITY OR FAMILY LEAVE FORMS, YOU MUST BRING THEM TO THE OFFICE FOR PROCESSING.   °DO NOT GIVE THEM TO YOUR DOCTOR. ° °1. A prescription for pain medication may be given to you upon discharge.  Take your pain medication as prescribed, if needed.  If narcotic pain medicine is not needed, then you may take acetaminophen (Tylenol) or ibuprofen (Advil) as needed. °2. Take your usually prescribed medications unless otherwise directed. °3. If you need a refill on your pain medication, please contact your pharmacy.  They will contact our office to request authorization. Prescriptions will not be filled after 5pm or on week-ends. °4. You should follow a light diet the first few days after arrival home, such as soup and crackers, etc.  Be sure to include lots of fluids daily. °5. Most patients will experience some swelling and bruising in the area of the incisions.  Ice packs will help.  Swelling and bruising can take several days to resolve.  °6. It is common to experience some constipation if taking pain medication after surgery.  Increasing fluid intake and taking a stool softener (such as Colace) will usually help or prevent this problem from occurring.  A mild laxative (Milk of Magnesia or Miralax) should be taken according to package instructions if there are no bowel movements after 48 hours. °7. Unless discharge instructions indicate otherwise, you may remove your bandages 24-48 hours after surgery, and you may shower at that time.  You may have steri-strips (small skin tapes) in place directly over the incision.  These strips should be left on the skin for 7-10 days.  If your surgeon used skin glue on the incision, you may shower in 24 hours.  The glue will flake off over the next 2-3 weeks.  Any sutures or  staples will be removed at the office during your follow-up visit. °8. ACTIVITIES:  You may resume regular (light) daily activities beginning the next day--such as daily self-care, walking, climbing stairs--gradually increasing activities as tolerated.  You may have sexual intercourse when it is comfortable.  Refrain from any heavy lifting or straining until approved by your doctor. °a. You may drive when you are no longer taking prescription pain medication, you can comfortably wear a seatbelt, and you can safely maneuver your car and apply brakes. °b. RETURN TO WORK:  __________________________________________________________ °9. You should see your doctor in the office for a follow-up appointment approximately 2-3 weeks after your surgery.  Make sure that you call for this appointment within a day or two after you arrive home to insure a convenient appointment time. °10. OTHER INSTRUCTIONS: __________________________________________________________________________________________________________________________ __________________________________________________________________________________________________________________________ °WHEN TO CALL YOUR DOCTOR: °1. Fever over 101.0 °2. Inability to urinate °3. Continued bleeding from incision. °4. Increased pain, redness, or drainage from the incision. °5. Increasing abdominal pain ° °The clinic staff is available to answer your questions during regular business hours.  Please don’t hesitate to call and ask to speak to one of the nurses for clinical concerns.  If you have a medical emergency, go to the nearest emergency room or call 911.  A surgeon from [facility] is always on call at the hospital. °[address], [address], [place], [place]  [address] ? [address] [address], [place],    [address] °[phone] ? [phone] ? FAX [phone] °Web site:   [email or web address]   [facility] - DISCHARGE INSTRUCTIONS TO  PATIENT  Return to work on:  05/16/2014  Activity:  Driving - may drive in 3 or 4 days, if doing well   Lifting - No lifting more than 15 pounds for 1 week  Wound Care:   May shower starting tomorrow  Diet:  As tolerated  Follow up appointment:  Call Dr. Jamse MeadHoxworth's office [facility]) at [phone] for an appointment in 2 to 3 weeks  Medications and dosages:  Resume your home medications.  You have a prescription for:  Vicodin and Cipro  Call Dr. Johna SheriffHoxworth or his office  [phone]) if you have:  Temperature greater than 100.4,  Persistent nausea and vomiting,  Severe uncontrolled pain,  Redness, tenderness, or signs of infection (pain, swelling, redness, odor or green/yellow discharge around the site),  Any other questions or concerns you may have after discharge.  In an emergency, call 911 or go to an Emergency Department at a nearby hospital.

## 2015-05-07 NOTE — Progress Notes (Signed)
Pt received from PACU  A&Ox4. Abdominal surgical site with 3 Liquid adhesive  Incision sites. The site at the subiculum had a small amt of bloody drainage. A sterile 2x2 gauze was placed with paper tape and monitored during the shift. Day shift  RN was made aware.

## 2015-06-21 ENCOUNTER — Emergency Department (HOSPITAL_COMMUNITY)
Admission: EM | Admit: 2015-06-21 | Discharge: 2015-06-22 | Disposition: A | Discharge status: Home / Self Care | Payer: Self-pay | Attending: Emergency Medicine | Admitting: Emergency Medicine

## 2015-06-21 ENCOUNTER — Encounter (HOSPITAL_COMMUNITY): Payer: Self-pay | Admitting: Emergency Medicine

## 2015-06-21 ENCOUNTER — Emergency Department (HOSPITAL_COMMUNITY): Payer: Self-pay

## 2015-06-21 DIAGNOSIS — L03311 Cellulitis of abdominal wall: Secondary | ICD-10-CM | POA: Insufficient documentation

## 2015-06-21 DIAGNOSIS — Z9049 Acquired absence of other specified parts of digestive tract: Secondary | ICD-10-CM | POA: Insufficient documentation

## 2015-06-21 DIAGNOSIS — Z8659 Personal history of other mental and behavioral disorders: Secondary | ICD-10-CM | POA: Insufficient documentation

## 2015-06-21 DIAGNOSIS — F17200 Nicotine dependence, unspecified, uncomplicated: Secondary | ICD-10-CM | POA: Insufficient documentation

## 2015-06-21 DIAGNOSIS — R42 Dizziness and giddiness: Secondary | ICD-10-CM | POA: Insufficient documentation

## 2015-06-21 DIAGNOSIS — M419 Scoliosis, unspecified: Secondary | ICD-10-CM | POA: Insufficient documentation

## 2015-06-21 LAB — CBC WITH DIFFERENTIAL/PLATELET
BASOS ABS: 0 10*3/uL (ref 0.0–0.1)
Basophils Relative: 0 %
EOS PCT: 4 %
Eosinophils Absolute: 0.3 10*3/uL (ref 0.0–0.7)
HEMATOCRIT: 39.7 % (ref 36.0–46.0)
Hemoglobin: 12.8 g/dL (ref 12.0–15.0)
LYMPHS ABS: 2.9 10*3/uL (ref 0.7–4.0)
LYMPHS PCT: 34 %
MCH: 30.1 pg (ref 26.0–34.0)
MCHC: 32.2 g/dL (ref 30.0–36.0)
MCV: 93.4 fL (ref 78.0–100.0)
MONO ABS: 0.8 10*3/uL (ref 0.1–1.0)
Monocytes Relative: 9 %
NEUTROS ABS: 4.5 10*3/uL (ref 1.7–7.7)
Neutrophils Relative %: 53 %
Platelets: 331 10*3/uL (ref 150–400)
RBC: 4.25 MIL/uL (ref 3.87–5.11)
RDW: 13.5 % (ref 11.5–15.5)
WBC: 8.4 10*3/uL (ref 4.0–10.5)

## 2015-06-21 LAB — BASIC METABOLIC PANEL
ANION GAP: 10 (ref 5–15)
BUN: 11 mg/dL (ref 6–20)
CO2: 23 mmol/L (ref 22–32)
Calcium: 9.2 mg/dL (ref 8.9–10.3)
Chloride: 109 mmol/L (ref 101–111)
Creatinine, Ser: 0.64 mg/dL (ref 0.44–1.00)
GFR calc Af Amer: >60 mL/min (ref >60)
GFR calc non Af Amer: >60 mL/min (ref >60)
GLUCOSE: 98 mg/dL (ref 65–99)
POTASSIUM: 3.6 mmol/L (ref 3.5–5.1)
Sodium: 142 mmol/L (ref 135–145)

## 2015-06-21 NOTE — ED Provider Notes (Signed)
CSN: [id number]     Arrival date & time 06/21/15  1542 History  By signing my name below, I, Soijett Blue, attest that this documentation has been prepared under the direction and in the presence of Marlon Pel, PA-C Electronically Signed: Soijett Blue, ED Scribe. 06/21/2015. 10:37 PM.   Chief Complaint  Patient presents with  . Post-op Problem     The history is provided by the patient. No language interpreter was used.    Brandi Hoffman is a 25 y.o. female with a medical hx of GERD who presents to the Emergency Department complaining of post-op problem onset 4 days. She reports that since her appendectomy on 05/06/2015  she has had no issues. She notes that she has had drainage to the surgical site x 4 days. She states that the drainage was initially clear and it is now yellow. She has mild pain to the surgical site. Pt is having associated symptoms of nausea, lightheadedness, redness, and itching to the surgical site. She notes that she has tried lavender oil for the relief of her itching surrounding the surgical site. Pt has been using hydrogen peroxide for cleaning purposes of the surgical site. She denies fever, vomiting, and any other symptoms.   Pt secondarily complains of dark urine and she has concerns for a yeast infection or bacterial infection at this time. Pt denies any other symptoms at this time.   Per pt chart review: Pt had a laparoscopic appendectomy completed on 05/06/2015 at [facility] by Dr. Glenna Fellows.  Past Medical History  Diagnosis Date  . ADHD (attention deficit hyperactivity disorder)   . Scoliosis   . GERD (gastroesophageal reflux disease)   . Pregnant    Past Surgical History  Procedure Laterality Date  . Multiple tooth extractions    . Laparoscopic appendectomy N/A 05/06/2015    Procedure: APPENDECTOMY LAPAROSCOPIC;  Surgeon: Glenna Fellows, MD;  Location: [facility];  Service: General;  Laterality: N/A;   Family History  Problem Relation Age  of Onset  . Adopted: Yes   Social History  Substance Use Topics  . Smoking status: Current Some Day Smoker  . Smokeless tobacco: None  . Alcohol Use: No   OB History    Gravida Para Term Preterm AB TAB SAB Ectopic Multiple Living   Review of Systems  Constitutional: Negative for fever.  Gastrointestinal: Positive for nausea. Negative for vomiting.  Genitourinary: Negative for dysuria, frequency and difficulty urinating.  Skin: Positive for color change.       Drainage and itching to the surgical site  Neurological: Positive for light-headedness.  All other systems reviewed and are negative.     Allergies  Review of patient's allergies indicates no known allergies.  Home Medications   Prior to Admission medications   Medication Sig Start Date End Date Taking? Authorizing Provider  cephALEXin (KEFLEX) 500 MG capsule Take 1 capsule (500 mg total) by mouth 4 (four) times daily. 06/22/15   Aundra Espin Neva Seat, PA-C  ciprofloxacin (CIPRO) 500 MG tablet Take 1 tablet (500 mg total) by mouth 2 (two) times daily. Patient not taking: Reported on 06/21/2015 05/07/15   Ovidio Kin, MD  HYDROcodone-acetaminophen (NORCO/VICODIN) 5-325 MG tablet Take 1-2 tablets by mouth every 4 (four) hours as needed for moderate pain. 05/07/15   Ovidio Kin, MD  methocarbamol (ROBAXIN) 500 MG tablet Take 1 tablet (500 mg total) by mouth 2 (two) times daily. Patient not taking:  Reported on 06/21/2015 04/26/15   Rolland Porter, MD  naproxen (NAPROSYN) 500 MG tablet Take 1 tablet (500 mg total) by mouth 2 (two) times daily. Patient not taking: Reported on 06/21/2015 04/26/15   Rolland Porter, MD   BP 100/63 mmHg  Pulse 88  Temp(Src) 98 F (36.7 C) (Oral)  Resp 18  SpO2 98%  LMP  Physical Exam  Constitutional: She is oriented to person, place, and time. She appears well-developed and well-nourished. No distress.  HENT:  Head: Normocephalic and atraumatic.  Eyes: EOM are normal.  Neck: Neck  supple.  Cardiovascular: Normal rate.   Pulmonary/Chest: Effort normal. No respiratory distress.  Abdominal: Soft. There is tenderness.  Tenderness to right side of umbilicus with associated erythema, irritation to the skin and fluid drainage. Fluid is non-odorous and can be expressed from the surgical wound. All other surgical incisions are well healed.   Musculoskeletal: Normal range of motion.  Neurological: She is alert and oriented to person, place, and time.  Skin: Skin is warm and dry.  Psychiatric: She has a normal mood and affect. Her behavior is normal.  Nursing note and vitals reviewed.   ED Course  Procedures (including critical care time) DIAGNOSTIC STUDIES: Oxygen Saturation is 100% on RA, nl by my interpretation.    COORDINATION OF CARE: 10:37 PM Discussed treatment plan with pt at bedside which includes CT abdomen pelvis with contrast, scan, UA, labs, and pt agreed to plan.    Labs Review Labs Reviewed  URINE CULTURE  CBC WITH DIFFERENTIAL/PLATELET  BASIC METABOLIC PANEL  HCG, QUANTITATIVE, PREGNANCY  URINALYSIS, ROUTINE W REFLEX MICROSCOPIC (NOT AT [facility])    Imaging Review Ct Abdomen Pelvis W Contrast  06/22/2015  CLINICAL DATA:  25 year old female status post recent appendectomy with leakage around the umbilical surgical site. EXAM: CT ABDOMEN AND PELVIS WITH CONTRAST TECHNIQUE: Multidetector CT imaging of the abdomen and pelvis was performed using the standard protocol following bolus administration of intravenous contrast. CONTRAST:  OMNIPAQUE IOHEXOL 300 MG/ML  SOLN COMPARISON:  CT dated 05/06/2015 FINDINGS: The visualized lung bases are clear. No intra-abdominal free air or free fluid. The gallbladder is filled with stones. No pericholecystic fluid. Ultrasound may provide better evaluation of the gallbladder. Stable appearing small hypodensity noted at dome of the liver which may represent an area of scarring. MRI may provide better characterization if  clinically indicated. The pancreas, spleen, adrenal glands, kidneys was, visualized ureters appear unremarkable. The urinary bladder is collapsed. The uterus and the ovaries are grossly unremarkable. There is moderate stool throughout the colon. There is no evidence of bowel obstruction or inflammation. Appendectomy. The abdominal aorta and IVC appear unremarkable. No portal venous gas identified. There is no adenopathy. There is induration of the subcutaneous soft tissues of the periumbilical region, likely related to laparoscopic port insertion. Superimposed infection is not excluded. No drainable fluid collection/abscess identified at this site. A 2.5 x 2.0 cm cystic structure is noted in the subcutaneous soft tissues of the left flank which appears enlarged compared to prior study and may represent a sebaceous cyst. Ultrasound may provide better evaluation. The osseous structures are intact. IMPRESSION: Postsurgical changes of appendectomy with skin thickening and a infiltrative changes of the periumbilical subcutaneous soft tissues which may be sequela of laparoscopic port placement. Superimposed infection is not excluded. No drainable fluid collection or abscess identified at this site. Electronically Signed   By: Elgie Collard M.D.   On: 06/22/2015 01:13   I have personally reviewed and evaluated  these images and lab results as part of my medical decision-making.   EKG Interpretation None      MDM   Final diagnoses:  Cellulitis of abdominal wall   Patient does not look sick with normal vital signs. Her CBC, BMP are unremarkable and her pregnancy test is negative. Her CT scan shows post surgical changes but possible superimposed infection is not excluded. The fluid is clear color and not odorous. Will start her on high dose Keflex and she needs to see her Surgeon in the next 1-2 days. She has been given VERY strict return precautions. If she is unable to see the surgeon, her symptoms get  worse, develops nausea, vomiting, diarrhea.  She was complaining of dysuria but reports the end of her encounter the urine had not been done. Will add on a urine and urine culture. She is already going to be treated with Keflex which will also cover for UTI. I recommend she call her PCP as well.  I personally performed the services described in this documentation, which was scribed in my presence. The recorded information has been reviewed and is accurate.    Marlon Pel, PA-C 06/22/15 0205  April Palumbo, MD 06/22/15 [id number]

## 2015-06-21 NOTE — ED Notes (Signed)
Pt with Hx of appendectomy 6 weeks ago c/o serous non-odorous fluid draining from ubilical surgical site x 4 days. No bleeding at this time.

## 2015-06-21 NOTE — ED Notes (Signed)
PA at bedside.

## 2015-06-21 NOTE — ED Notes (Signed)
Unsuccessful IV attempt by this nurse.

## 2015-06-21 NOTE — ED Notes (Signed)
Pt called for role call all sections of lobby, not in lobby.

## 2015-06-22 ENCOUNTER — Encounter (HOSPITAL_COMMUNITY): Payer: Self-pay | Admitting: Radiology

## 2015-06-22 LAB — URINALYSIS, ROUTINE W REFLEX MICROSCOPIC
Bilirubin Urine: NEGATIVE
GLUCOSE, UA: NEGATIVE mg/dL
HGB URINE DIPSTICK: NEGATIVE
Ketones, ur: NEGATIVE mg/dL
NITRITE: NEGATIVE
PH: 5.5 (ref 5.0–8.0)
Protein, ur: NEGATIVE mg/dL
SPECIFIC GRAVITY, URINE: 1.035 — AB (ref 1.005–1.030)

## 2015-06-22 LAB — URINE MICROSCOPIC-ADD ON

## 2015-06-22 LAB — HCG, QUANTITATIVE, PREGNANCY: hCG, Beta Chain, Quant, S: <1 m[IU]/mL (ref <5)

## 2015-06-22 MED ORDER — CEPHALEXIN 500 MG PO CAPS
500.0000 mg | ORAL_CAPSULE | Freq: Once | ORAL | Status: AC
  Administered 2015-06-22: 500 mg via ORAL
  Filled 2015-06-22: qty 1

## 2015-06-22 MED ORDER — CEPHALEXIN 500 MG PO CAPS: 500.0000 mg | ORAL_CAPSULE | Freq: Four times a day (QID) | ORAL | Status: DC

## 2015-06-22 MED ORDER — IOHEXOL 300 MG/ML  SOLN
100.0000 mL | Freq: Once | INTRAMUSCULAR | Status: AC | PRN
  Administered 2015-06-22: 100 mL via INTRAVENOUS

## 2015-06-22 NOTE — ED Notes (Signed)
Pt transported to CT ?

## 2015-06-22 NOTE — ED Notes (Signed)
Pt ambulated to restroom. 

## 2015-06-22 NOTE — ED Notes (Signed)
PA at bedside.

## 2015-06-22 NOTE — Discharge Instructions (Signed)

## 2015-06-22 NOTE — ED Notes (Signed)
Pt returned from CT °

## 2015-06-23 LAB — URINE CULTURE: Special Requests: NORMAL

## 2016-06-20 ENCOUNTER — Emergency Department (HOSPITAL_COMMUNITY)
Admission: EM | Admit: 2016-06-20 | Discharge: 2016-06-20 | Disposition: A | Discharge status: Home / Self Care | Payer: Medicaid Other | Attending: Emergency Medicine | Admitting: Emergency Medicine

## 2016-06-20 ENCOUNTER — Encounter (HOSPITAL_COMMUNITY): Payer: Self-pay

## 2016-06-20 DIAGNOSIS — Z79899 Other long term (current) drug therapy: Secondary | ICD-10-CM | POA: Insufficient documentation

## 2016-06-20 DIAGNOSIS — F909 Attention-deficit hyperactivity disorder, unspecified type: Secondary | ICD-10-CM | POA: Insufficient documentation

## 2016-06-20 DIAGNOSIS — F17210 Nicotine dependence, cigarettes, uncomplicated: Secondary | ICD-10-CM | POA: Insufficient documentation

## 2016-06-20 DIAGNOSIS — J020 Streptococcal pharyngitis: Secondary | ICD-10-CM | POA: Insufficient documentation

## 2016-06-20 LAB — RAPID STREP SCREEN (MED CTR MEBANE ONLY): Streptococcus, Group A Screen (Direct): POSITIVE — AB

## 2016-06-20 MED ORDER — LIDOCAINE VISCOUS 2 % MT SOLN
15.0000 mL | Freq: Once | OROMUCOSAL | Status: AC
  Administered 2016-06-20: 15 mL via OROMUCOSAL
  Filled 2016-06-20: qty 15

## 2016-06-20 MED ORDER — PENICILLIN G BENZATHINE 1200000 UNIT/2ML IM SUSP
1.2000 10*6.[IU] | Freq: Once | INTRAMUSCULAR | Status: AC
  Administered 2016-06-20: 1.2 10*6.[IU] via INTRAMUSCULAR
  Filled 2016-06-20: qty 2

## 2016-06-20 MED ORDER — DEXAMETHASONE 1 MG/ML PO CONC
10.0000 mg | Freq: Once | ORAL | Status: AC
  Administered 2016-06-20: 10 mg via ORAL
  Filled 2016-06-20: qty 10

## 2016-06-20 NOTE — ED Triage Notes (Signed)
Patient complains of cough, congestion, severe sore throat and fever since Saturday, states that she is unable to swallow due to the pain. Taking otc meds with minimal relief

## 2016-06-20 NOTE — ED Provider Notes (Signed)
[facility]-EMERGENCY DEPT Provider Note   CSN: [id number] Arrival date & time: 06/20/16  1331  By signing my name below, I, Sonum Patel, attest that this documentation has been prepared under the direction and in the presence of Alvira Monday, MD. Electronically Signed: Sonum Patel, Neurosurgeon. 06/20/16. 3:30 PM.  History   Chief Complaint Chief Complaint  Patient presents with  . cough, congestion, fever, chills    The history is provided by the patient. No language interpreter was used.     HPI Comments: Brandi Hoffman is a 26 y.o. female who presents to the Emergency Department complaining of 3 days of a gradual onset, constant, gradually worsened sore throat with associated congestion, right ear pain, subjective fever, chills, hoarse voice and generalized body aches. She notes cough that is productive of thick sputum. She notes nausea, vomiting, and diarrhea earlier this morning. She has tried OTC medications without relief. She states the pain is worse with swallowing and severe. She denies abdominal pain.  Pain with swallowing. Feels voice is different "like I have a film over the back of my throat".    Past Medical History:  Diagnosis Date  . ADHD (attention deficit hyperactivity disorder)   . GERD (gastroesophageal reflux disease)   . Pregnant   . Scoliosis     Patient Active Problem List   Diagnosis Date Noted  . Acute gangrenous appendicitis 05/06/2015  . NSVD (normal spontaneous vaginal delivery) 05/08/2013  . Normal pregnancy 05/07/2013    Past Surgical History:  Procedure Laterality Date  . LAPAROSCOPIC APPENDECTOMY N/A 05/06/2015   Procedure: APPENDECTOMY LAPAROSCOPIC;  Surgeon: Glenna Fellows, MD;  Location: [facility];  Service: General;  Laterality: N/A;  . MULTIPLE TOOTH EXTRACTIONS      OB History    Gravida Para Term Preterm AB Living   2 2 2     2    SAB TAB Ectopic Multiple Live Births           2       Home Medications    Prior to Admission  medications   Medication Sig Start Date End Date Taking? Authorizing Provider  cephALEXin (KEFLEX) 500 MG capsule Take 1 capsule (500 mg total) by mouth 4 (four) times daily. 06/22/15   Tiffany Neva Seat, PA-C  ciprofloxacin (CIPRO) 500 MG tablet Take 1 tablet (500 mg total) by mouth 2 (two) times daily. Patient not taking: Reported on 06/21/2015 05/07/15   Ovidio Kin, MD  HYDROcodone-acetaminophen (NORCO/VICODIN) 5-325 MG tablet Take 1-2 tablets by mouth every 4 (four) hours as needed for moderate pain. 05/07/15   Ovidio Kin, MD  methocarbamol (ROBAXIN) 500 MG tablet Take 1 tablet (500 mg total) by mouth 2 (two) times daily. Patient not taking: Reported on 06/21/2015 04/26/15   Rolland Porter, MD  naproxen (NAPROSYN) 500 MG tablet Take 1 tablet (500 mg total) by mouth 2 (two) times daily. Patient not taking: Reported on 06/21/2015 04/26/15   Rolland Porter, MD    Family History Family History  Problem Relation Age of Onset  . Adopted: Yes    Social History Social History  Substance Use Topics  . Smoking status: Current Some Day Smoker  . Smokeless tobacco: Not on file  . Alcohol use No     Allergies   Patient has no known allergies.   Review of Systems Review of Systems  Constitutional: Positive for chills and fever.  HENT: Positive for congestion, ear pain, sore throat and voice change.   Eyes: Negative for visual disturbance.  Respiratory:  Positive for cough. Negative for shortness of breath.   Cardiovascular: Negative for chest pain.  Gastrointestinal: Positive for diarrhea, nausea and vomiting. Negative for abdominal pain.  Genitourinary: Negative for difficulty urinating.  Musculoskeletal: Positive for myalgias. Negative for back pain and neck pain.  Skin: Negative for rash.  Neurological: Negative for syncope and headaches.     Physical Exam Updated Vital Signs BP 107/69   Pulse 112   Temp 99.4 F (37.4 C) (Oral)   Resp 18   SpO2 99%   Physical Exam    Constitutional: She is oriented to person, place, and time. She appears well-developed and well-nourished. No distress.  HENT:  Head: Normocephalic and atraumatic.  Right Ear: External ear normal.  Left Ear: External ear normal.  Nose: Nose normal.  Mouth/Throat: Oropharyngeal exudate and posterior oropharyngeal edema present.  Bilateral tonsillar hypertrophy. No uvular deviation. No pooling of secretions. No stridor.   Eyes: Conjunctivae and EOM are normal. Pupils are equal, round, and reactive to light.  Neck: Normal range of motion. Neck supple.  Cardiovascular: Normal rate, regular rhythm, normal heart sounds and intact distal pulses.  Exam reveals no gallop and no friction rub.   No murmur heard. Pulmonary/Chest: Effort normal and breath sounds normal. No stridor. No respiratory distress. She has no wheezes. She has no rales. She exhibits no tenderness.  Abdominal: Soft. She exhibits no distension. There is no tenderness. There is no guarding.  Musculoskeletal: She exhibits no edema or tenderness.  Lymphadenopathy:    She has cervical adenopathy.  Neurological: She is alert and oriented to person, place, and time.  Skin: Skin is warm and dry. No rash noted. She is not diaphoretic. No erythema.  Nursing note and vitals reviewed.    ED Treatments / Results  DIAGNOSTIC STUDIES: Oxygen Saturation is 99% on RA, normal by my interpretation.    COORDINATION OF CARE: 3:25 PM Discussed treatment plan with pt at bedside and pt agreed to plan.   Labs (all labs ordered are listed, but only abnormal results are displayed) Labs Reviewed  RAPID STREP SCREEN (NOT AT [facility]) - Abnormal; Notable for the following:       Result Value   Streptococcus, Group A Screen (Direct) POSITIVE (*)    All other components within normal limits    EKG  EKG Interpretation None       Radiology No results found.  Procedures Procedures (including critical care time)  Medications Ordered in  ED Medications  penicillin g benzathine (BICILLIN LA) 1200000 UNIT/2ML injection 1.2 Million Units (not administered)  dexamethasone (DECADRON) 1 MG/ML solution 10 mg (not administered)  lidocaine (XYLOCAINE) 2 % viscous mouth solution 15 mL (not administered)     Initial Impression / Assessment and Plan / ED Course  I have reviewed the triage vital signs and the nursing notes.  Pertinent labs & imaging results that were available during my care of the patient were reviewed by me and considered in my medical decision making (see chart for details).     26 year old female presents with concern for sore throat, cough, congestion and fever. Patient has enlarged tonsils, however no sign of PTA, RPA, epiglottitis.  She has no pooling of secretions, no stridor, voice without clear abnormalities to me although she does report some change, and has full ROM of neck and is well appearing. Doubt pneumonia, no hypoxia, no tachypnea, clear breath sounds.  Strep screen positive.  Gave penicillin IM, decadron, lidocaine for symptomatic relief.  Tachycardia, likely secondary to  dehydration. Patient discharged in stable condition with understanding of reasons to return.   Final Clinical Impressions(s) / ED Diagnoses   Final diagnoses:  Strep pharyngitis    New Prescriptions New Prescriptions   No medications on file   I personally performed the services described in this documentation, which was scribed in my presence. The recorded information has been reviewed and is accurate.    Alvira Monday, MD 06/20/16 1544

## 2016-09-25 ENCOUNTER — Encounter (HOSPITAL_COMMUNITY): Payer: Self-pay | Admitting: Emergency Medicine

## 2016-09-25 ENCOUNTER — Emergency Department (HOSPITAL_COMMUNITY)
Admission: EM | Admit: 2016-09-25 | Discharge: 2016-09-25 | Disposition: A | Discharge status: Home / Self Care | Payer: Self-pay | Attending: Emergency Medicine | Admitting: Emergency Medicine

## 2016-09-25 ENCOUNTER — Emergency Department (HOSPITAL_COMMUNITY): Payer: Self-pay

## 2016-09-25 DIAGNOSIS — F17200 Nicotine dependence, unspecified, uncomplicated: Secondary | ICD-10-CM | POA: Insufficient documentation

## 2016-09-25 DIAGNOSIS — Z79899 Other long term (current) drug therapy: Secondary | ICD-10-CM | POA: Insufficient documentation

## 2016-09-25 DIAGNOSIS — F909 Attention-deficit hyperactivity disorder, unspecified type: Secondary | ICD-10-CM | POA: Insufficient documentation

## 2016-09-25 DIAGNOSIS — R1032 Left lower quadrant pain: Secondary | ICD-10-CM | POA: Insufficient documentation

## 2016-09-25 DIAGNOSIS — R109 Unspecified abdominal pain: Secondary | ICD-10-CM

## 2016-09-25 LAB — COMPREHENSIVE METABOLIC PANEL
ALBUMIN: 3.7 g/dL (ref 3.5–5.0)
ALK PHOS: 59 U/L (ref 38–126)
ALT: 14 U/L (ref 14–54)
ANION GAP: 6 (ref 5–15)
AST: 20 U/L (ref 15–41)
BILIRUBIN TOTAL: 0.1 mg/dL — AB (ref 0.3–1.2)
BUN: 6 mg/dL (ref 6–20)
CALCIUM: 8.7 mg/dL — AB (ref 8.9–10.3)
CO2: 24 mmol/L (ref 22–32)
CREATININE: 0.62 mg/dL (ref 0.44–1.00)
Chloride: 109 mmol/L (ref 101–111)
GFR calc Af Amer: >60 mL/min (ref >60)
GFR calc non Af Amer: >60 mL/min (ref >60)
GLUCOSE: 126 mg/dL — AB (ref 65–99)
Potassium: 3.7 mmol/L (ref 3.5–5.1)
Sodium: 139 mmol/L (ref 135–145)
TOTAL PROTEIN: 6.4 g/dL — AB (ref 6.5–8.1)

## 2016-09-25 LAB — CBC
HCT: 39.1 % (ref 36.0–46.0)
HEMOGLOBIN: 12.8 g/dL (ref 12.0–15.0)
MCH: 30 pg (ref 26.0–34.0)
MCHC: 32.7 g/dL (ref 30.0–36.0)
MCV: 91.6 fL (ref 78.0–100.0)
PLATELETS: 281 10*3/uL (ref 150–400)
RBC: 4.27 MIL/uL (ref 3.87–5.11)
RDW: 13 % (ref 11.5–15.5)
WBC: 8.5 10*3/uL (ref 4.0–10.5)

## 2016-09-25 LAB — URINALYSIS, ROUTINE W REFLEX MICROSCOPIC
BILIRUBIN URINE: NEGATIVE
Glucose, UA: NEGATIVE mg/dL
HGB URINE DIPSTICK: NEGATIVE
KETONES UR: NEGATIVE mg/dL
Leukocytes, UA: NEGATIVE
NITRITE: NEGATIVE
PROTEIN: NEGATIVE mg/dL
Specific Gravity, Urine: 1.021 (ref 1.005–1.030)
pH: 6 (ref 5.0–8.0)

## 2016-09-25 LAB — PREGNANCY, URINE: PREG TEST UR: NEGATIVE

## 2016-09-25 LAB — LIPASE, BLOOD: Lipase: 24 U/L (ref 11–51)

## 2016-09-25 NOTE — ED Provider Notes (Signed)
[facility]-EMERGENCY DEPT Provider Note   CSN: [id number] Arrival date & time: 09/25/16  0112     History   Chief Complaint Chief Complaint  Patient presents with  . Flank Pain  . multiple complaints    HPI Brandi Hoffman is a 26 y.o. female.  HPI Brandi Hoffman is a 26 y.o. female presents to emergency department complaining of left flank pain. Patient states she has had pain in her left flank on and off for approximately 6 months. She states that initially the pain would come and go, reports pain is sharp, radiating in the left lower quadrant. States that in the last few days, pain has become more constant with constant dull pain and sharp pain shooting down intermittently. Denies any Zosyn nausea or vomiting. No fever or chills. No urinary symptoms. Denies vaginal discharge or bleeding. Denies being pregnant. States "feels like back labors." Patient is also complaining of a small cyst to the skin of the left lower back that she would like to have removed.  Past Medical History:  Diagnosis Date  . ADHD (attention deficit hyperactivity disorder)   . GERD (gastroesophageal reflux disease)   . Pregnant   . Scoliosis     Patient Active Problem List   Diagnosis Date Noted  . Acute gangrenous appendicitis 05/06/2015  . NSVD (normal spontaneous vaginal delivery) 05/08/2013  . Normal pregnancy 05/07/2013    Past Surgical History:  Procedure Laterality Date  . LAPAROSCOPIC APPENDECTOMY N/A 05/06/2015   Procedure: APPENDECTOMY LAPAROSCOPIC;  Surgeon: Glenna FellowsBenjamin Hoxworth, MD;  Location: [facility];  Service: General;  Laterality: N/A;  . MULTIPLE TOOTH EXTRACTIONS      OB History    Gravida Para Term Preterm AB Living   2 2 2     2    SAB TAB Ectopic Multiple Live Births           2       Home Medications    Prior to Admission medications   Medication Sig Start Date End Date Taking? Authorizing Provider  cephALEXin (KEFLEX) 500 MG capsule Take 1 capsule (500 mg total) by mouth  4 (four) times daily. Patient not taking: Reported on 09/25/2016 06/22/15   Marlon PelGreene, Tiffany, PA-C  ciprofloxacin (CIPRO) 500 MG tablet Take 1 tablet (500 mg total) by mouth 2 (two) times daily. Patient not taking: Reported on 06/21/2015 05/07/15   Ovidio KinNewman, David, MD  HYDROcodone-acetaminophen (NORCO/VICODIN) 5-325 MG tablet Take 1-2 tablets by mouth every 4 (four) hours as needed for moderate pain. Patient not taking: Reported on 09/25/2016 05/07/15   Ovidio KinNewman, David, MD  methocarbamol (ROBAXIN) 500 MG tablet Take 1 tablet (500 mg total) by mouth 2 (two) times daily. Patient not taking: Reported on 06/21/2015 04/26/15   Rolland PorterJames, Mark, MD  naproxen (NAPROSYN) 500 MG tablet Take 1 tablet (500 mg total) by mouth 2 (two) times daily. Patient not taking: Reported on 06/21/2015 04/26/15   Rolland PorterJames, Mark, MD    Family History Family History  Problem Relation Age of Onset  . Adopted: Yes    Social History Social History  Substance Use Topics  . Smoking status: Current Some Day Smoker  . Smokeless tobacco: Not on file  . Alcohol use No     Allergies   Patient has no known allergies.   Review of Systems Review of Systems  Constitutional: Negative for chills and fever.  Respiratory: Negative for cough, chest tightness and shortness of breath.   Cardiovascular: Negative for chest pain, palpitations and leg swelling.  Gastrointestinal:  Positive for abdominal pain. Negative for diarrhea, nausea and vomiting.  Genitourinary: Positive for flank pain. Negative for dysuria, pelvic pain, vaginal bleeding, vaginal discharge and vaginal pain.  Musculoskeletal: Negative for arthralgias, myalgias, neck pain and neck stiffness.  Skin: Negative for rash.  Neurological: Negative for dizziness, weakness and headaches.  All other systems reviewed and are negative.    Physical Exam Updated Vital Signs BP 98/67 (BP Location: Left Arm)   Pulse 86   Temp 98.3 F (36.8 C) (Oral)   Resp 18   Ht 5\' 5"  (1.651 m)    Wt 150 lb (68 kg)   LMP 09/14/2016 (Approximate)   SpO2 99%   BMI 24.96 kg/m   Physical Exam  Constitutional: She is oriented to person, place, and time. She appears well-developed and well-nourished. No distress.  HENT:  Head: Normocephalic.  Eyes: Conjunctivae are normal.  Neck: Neck supple.  Cardiovascular: Normal rate, regular rhythm and normal heart sounds.   Pulmonary/Chest: Effort normal and breath sounds normal. No respiratory distress. She has no wheezes. She has no rales.  Abdominal: Soft. Bowel sounds are normal. She exhibits no distension. There is no rebound.  Left CVA tenderness, left upper and lower quadrant abdominal tenderness  Musculoskeletal: She exhibits no edema.  Neurological: She is alert and oriented to person, place, and time.  Skin: Skin is warm and dry.  Psychiatric: She has a normal mood and affect. Her behavior is normal.  Nursing note and vitals reviewed.    ED Treatments / Results  Labs (all labs ordered are listed, but only abnormal results are displayed) Labs Reviewed  COMPREHENSIVE METABOLIC PANEL - Abnormal; Notable for the following:       Result Value   Glucose, Bld 126 (*)    Calcium 8.7 (*)    Total Protein 6.4 (*)    Total Bilirubin 0.1 (*)    All other components within normal limits  LIPASE, BLOOD  CBC  URINALYSIS, ROUTINE W REFLEX MICROSCOPIC  PREGNANCY, URINE    EKG  EKG Interpretation None       Radiology Ct Renal Stone Study  Result Date: 09/25/2016 CLINICAL DATA:  Left flank pain EXAM: CT ABDOMEN AND PELVIS WITHOUT CONTRAST TECHNIQUE: Multidetector CT imaging of the abdomen and pelvis was performed following the standard protocol without IV contrast. COMPARISON:  CT abdomen pelvis 06/22/2015 FINDINGS: Lower chest: No pulmonary nodules or pleural effusion. No visible pericardial effusion. Hepatobiliary: Normal noncontrast appearance of the liver. There are numerous gallstones but no evidence of cholecystitis. Pancreas:  Normal noncontrast appearance of the pancreas. No peripancreatic fluid collection. Spleen: Normal. Adrenals/Urinary Tract: --Adrenal glands: Normal. --Right kidney/ureter: There is a 2 mm stone near the right lower pole. No hydronephrosis. The right ureter is unobstructed. --Left kidney/ureter: There is a 2 mm stone near the lower pole of the left kidney. No hydronephrosis or perinephric stranding. The left ureter is unobstructed. --Urinary bladder: Unremarkable. Stomach/Bowel: There is no hiatal hernia. The stomach and duodenum are normal. There is no dilated small bowel or enteric inflammation. There is no colonic abnormality. The appendix is surgically absent. Vascular/Lymphatic: No abdominal aortic aneurysm or atherosclerotic calcification. No abdominal or pelvic lymphadenopathy. Reproductive: Normal uterus and ovaries. Musculoskeletal. No focal osseous lesions. No bony spinal canal stenosis. Low-attenuation lesion in the subcutaneous tissue of the left flank is likely a sebaceous cyst or epidermal inclusion cyst. IMPRESSION: 1. No obstructive uropathy. 2. Bilateral 2 mm nonobstructing renal stones. 3. Cholelithiasis. Electronically Signed   By: Deatra Robinson  M.D.   On: 09/25/2016 05:31    Procedures Procedures (including critical care time)  Medications Ordered in ED Medications - No data to display   Initial Impression / Assessment and Plan / ED Course  I have reviewed the triage vital signs and the nursing notes.  Pertinent labs & imaging results that were available during my care of the patient were reviewed by me and considered in my medical decision making (see chart for details).     Patient in emergency department with intermittent left flank pain. She states when pain is severe it is 10 out of 10. Patient is comfortable. This time. Urinalysis and labs with no significant abnormalities. Patient is concerned this could be a kidney stone. Will get a CT renal for further evaluation. Will  also add a pregnancy test.  Negative pregnancy test. CT negative, other than intrarenal stones and cholelithiasis for which patient is asymptomatic. Advised to take appropriate Tylenol for pain. I do think  Pt may have passed a stone which would explain her severe pain which also now resolved. Will have patient follow-up with urology as needed. Return precautions discussed.  Vitals:   09/25/16 0121 09/25/16 0132 09/25/16 0505  BP: 98/67  (!) 99/55  Pulse: 86  83  Resp: 18    Temp: 98.3 F (36.8 C)    TempSrc: Oral    SpO2: 99%  100%  Weight:  150 lb (68 kg)   Height:  5\' 5"  (1.651 m)      Final Clinical Impressions(s) / ED Diagnoses   Final diagnoses:  Left flank pain    New Prescriptions New Prescriptions   No medications on file     Jaynie Crumble, PA-C 09/25/16 4098    Shon Baton, MD 09/27/16 [phone]

## 2016-09-25 NOTE — ED Notes (Signed)
ED Provider at bedside. 

## 2016-09-25 NOTE — ED Triage Notes (Signed)
Pt presents w/ "several things I want to have looked at."  States she has been having flank pain for 6 months however it is getting worse.  Denies N/V/D, fevers, weakness.

## 2016-09-25 NOTE — Discharge Instructions (Signed)
Although your CT scan is not showing an obstructing or passing stone, you do have small stones inside your kidneys. These usually do not cause any problems or symptoms unless you start passing them. You can take tylenol or motrin for pain. You can follow up with urology if continue having severe pain. If pain worsening or develop fever, vomiting, return to ED

## 2016-12-18 ENCOUNTER — Emergency Department (HOSPITAL_BASED_OUTPATIENT_CLINIC_OR_DEPARTMENT_OTHER)
Admission: EM | Admit: 2016-12-18 | Discharge: 2016-12-18 | Disposition: A | Discharge status: Home / Self Care | Payer: Medicaid Other | Attending: Emergency Medicine | Admitting: Emergency Medicine

## 2016-12-18 ENCOUNTER — Encounter (HOSPITAL_BASED_OUTPATIENT_CLINIC_OR_DEPARTMENT_OTHER): Payer: Self-pay | Admitting: *Deleted

## 2016-12-18 DIAGNOSIS — L03311 Cellulitis of abdominal wall: Secondary | ICD-10-CM

## 2016-12-18 DIAGNOSIS — Z87891 Personal history of nicotine dependence: Secondary | ICD-10-CM | POA: Insufficient documentation

## 2016-12-18 MED ORDER — CEPHALEXIN 500 MG PO CAPS: 500.0000 mg | ORAL_CAPSULE | Freq: Four times a day (QID) | ORAL | 0 refills | Status: AC

## 2016-12-18 MED ORDER — CEPHALEXIN 250 MG PO CAPS
500.0000 mg | ORAL_CAPSULE | Freq: Once | ORAL | Status: AC
  Administered 2016-12-18: 500 mg via ORAL
  Filled 2016-12-18: qty 2

## 2016-12-18 NOTE — ED Provider Notes (Signed)
[facility] Provider Note   CSN: [id number] Arrival date & time: 12/18/16  1736   By signing my name below, I, Clarisse Gouge, attest that this documentation has been prepared under the direction and in the presence of Cadynce Garrette, Canary Brim, MD. Electronically signed, Clarisse Gouge, ED Scribe. 12/18/16. 7:11 PM.   History   Chief Complaint Chief Complaint  Patient presents with  . Abscess   The history is provided by the patient and medical records. No language interpreter was used.  Abscess  Location:  Torso Torso abscess location:  L flank Size:  1 cm Abscess quality: induration, painful and redness   Abscess quality: not draining, no fluctuance and no itching   Red streaking: no   Progression:  Worsening Pain details:    Quality:  Tightness   Severity:  Moderate   Timing:  Constant   Progression:  Worsening Chronicity:  Chronic Context: not immunosuppression   Associated symptoms: no fatigue, no fever, no headaches, no nausea and no vomiting   Risk factors: prior abscess     Brandi Hoffman is a 26 y.o. female presenting to the Emergency Department concerning a gradually worsening "cyst" on the L side of her abdomen x ~1 month. PT reports she has had the cyst there for greater than 5 years. Redness, swelling and chills associated. She describes moderate to severe soreness and aching, and a sensation as though it is "pushing in" on the affected area. Pt states her pain is worse with palpation. She states she has not attempted to drain the area. 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 No drainage, fever, N/V, dysuria, urgency, frequency, cough or rash elsewhere. No other complaints at this time.   Past Medical History:  Diagnosis Date  . ADHD (attention deficit hyperactivity disorder)   . GERD (gastroesophageal reflux disease)   . Pregnant   . Scoliosis     Patient Active Problem List   Diagnosis Date Noted  . Acute gangrenous  appendicitis 05/06/2015  . NSVD (normal spontaneous vaginal delivery) 05/08/2013  . Normal pregnancy 05/07/2013    Past Surgical History:  Procedure Laterality Date  . LAPAROSCOPIC APPENDECTOMY N/A 05/06/2015   Procedure: APPENDECTOMY LAPAROSCOPIC;  Surgeon: Glenna Fellows, MD;  Location: [facility];  Service: General;  Laterality: N/A;  . MULTIPLE TOOTH EXTRACTIONS      OB History    Gravida Para Term Preterm AB Living   2 2 2     2    SAB TAB Ectopic Multiple Live Births           2       Home Medications    Prior to Admission medications   Medication Sig Start Date End Date Taking? Authorizing Provider  cephALEXin (KEFLEX) 500 MG capsule Take 1 capsule (500 mg total) by mouth 4 (four) times daily. Patient not taking: Reported on 09/25/2016 06/22/15   Marlon Pel, PA-C  ciprofloxacin (CIPRO) 500 MG tablet Take 1 tablet (500 mg total) by mouth 2 (two) times daily. Patient not taking: Reported on 06/21/2015 05/07/15   Ovidio Kin, MD  HYDROcodone-acetaminophen (NORCO/VICODIN) 5-325 MG tablet Take 1-2 tablets by mouth every 4 (four) hours as needed for moderate pain. Patient not taking: Reported on 09/25/2016 05/07/15   Ovidio Kin, MD  methocarbamol (ROBAXIN) 500 MG tablet Take 1 tablet (500 mg total) by mouth 2 (two) times daily. Patient not taking: Reported on 06/21/2015 04/26/15   Rolland Porter, MD  naproxen (NAPROSYN) 500 MG tablet Take 1  tablet (500 mg total) by mouth 2 (two) times daily. Patient not taking: Reported on 06/21/2015 04/26/15   Rolland PorterJames, Mark, MD    Family History Family History  Problem Relation Age of Onset  . Adopted: Yes    Social History Social History  Substance Use Topics  . Smoking status: Former Games developermoker  . Smokeless tobacco: Never Used  . Alcohol use No     Allergies   Patient has no known allergies.   Review of Systems Review of Systems  Constitutional: Negative for fatigue and fever.  Respiratory: Negative for cough.     Gastrointestinal: Negative for nausea and vomiting.  Genitourinary: Negative for dysuria, frequency and urgency.  Skin: Positive for color change. Negative for rash and wound.  Neurological: Negative for headaches.  All other systems reviewed and are negative.    Physical Exam Updated Vital Signs BP 107/70 (BP Location: Right Arm)   Pulse 95   Temp 98.1 F (36.7 C) (Oral)   Resp 18   Ht 5\' 5"  (1.651 m)   Wt 150 lb (68 kg)   LMP 11/17/2016   SpO2 100%   BMI 24.96 kg/m   Physical Exam  Constitutional: She is oriented to person, place, and time. She appears well-developed and well-nourished. No distress.  HENT:  Head: Normocephalic and atraumatic.  Eyes: EOM are normal.  Neck: Normal range of motion.  Cardiovascular: Normal rate, regular rhythm and normal heart sounds.   Pulmonary/Chest: Effort normal and breath sounds normal. No respiratory distress. She has no decreased breath sounds. She has no wheezes. She has no rhonchi. She has no rales.  Abdominal: Soft. She exhibits no distension. There is no tenderness.  Musculoskeletal: Normal range of motion.  Neurological: She is alert and oriented to person, place, and time.  Skin: Skin is warm and dry. There is erythema.  1 cm palpable cyst to L latera abdomen with surrounding erythema and a purple 2 mm area overlying. No crepitus or tenderness.  Psychiatric: She has a normal mood and affect. Judgment normal.  Nursing note and vitals reviewed.   ED Treatments / Results  DIAGNOSTIC STUDIES: Oxygen Saturation is 100% on RA, NL by my interpretation.    COORDINATION OF CARE: 7:04 PM-Discussed next steps with pt. Pt verbalized understanding and is agreeable with the plan. Will order/ Rx abx. Pt prepared for d/c, advised of symptomatic care at home, F/U instructions and return precautions. Pt offered pain medications, which she declines at this time.   Labs (all labs ordered are listed, but only abnormal results are  displayed) Labs Reviewed - No data to display  EKG  EKG Interpretation None       Radiology No results found.  Procedures Procedures (including critical care time)  Medications Ordered in ED Medications  cephALEXin (KEFLEX) capsule 500 mg (500 mg Oral Given 12/18/16 1952)     Initial Impression / Assessment and Plan / ED Course  I have reviewed the triage vital signs and the nursing notes.  Pertinent labs & imaging results that were available during my care of the patient were reviewed by me and considered in my medical decision making (see chart for details).     Filbert BertholdMackenzie Nakanishi is a 26 y.o. female  With a past medical history significant for left sided cyst for many years who presents with rash and pain. Patient says that she has had a cyst on her left side for many years. Care everywhere and imaging review show that it has been seen  on CT and MRI for the last five years. Imaging shows it appears to be a benign cyst. Patient says that she has seen a dermatologist who did not want to remove it as it was not causing problems. Patient says that over the last month, it has had some redness around it. He says that over the last week it has become more painful and tender. She reports it has not drained and she has not tried to drainage herself. She has not taken medicine for the symptoms. She denies signs of systemic infection including no fevers, chills, nausea, vomiting, constipation, diarrhea, dysuria. She denies any other rash locations.   On exam, patient has a 3 cm area of erythema surrounding the palpable cyst. No drainage present. Mild to moderate tenderness present. No crepitance. No other areas of rash. Exam otherwise unremarkable.  As the patient has a known cyst in this location, do not feel patient has abscess. This examiner is hesitant to try and drain this cyst as this would likely introduce bacteria from the overlying cellulitis into the cyst. Patient will instead be  given prescription for antibiotics to treat the cellulitis and have her follow up with either plastic surgery, general surgery, or dermatology to discussed cyst removal. Patient advised that if her rack to improved over the next few days and symptoms do not improve, he may need to have it surgically removed as the infection may have spread inside it. Do not feel patient needs to have it drained currently.  Patient offered pain medication however, patient says she has had problems substance abuse in the past and wanted to treat it with over-the-counter medications. This was felt appropriate.  Patient voiced understanding of the plan of care for outpatient treatment of her cellulitis. Patient understood return precautions and follow instructions. Patient had no other questions or concerns and was discharged in good condition.    Final Clinical Impressions(s) / ED Diagnoses   Final diagnoses:  Cellulitis of abdominal wall    New Prescriptions Discharge Medication List as of 12/18/2016  7:38 PM    START taking these medications   Details  !! cephALEXin (KEFLEX) 500 MG capsule Take 1 capsule (500 mg total) by mouth 4 (four) times daily., Starting Mon 12/18/2016, Until Mon 12/25/2016, Print     !! - Potential duplicate medications found. Please discuss with provider.     I personally performed the services described in this documentation, which was scribed in my presence. The recorded information has been reviewed and is accurate.  Clinical Impression: 1. Cellulitis of abdominal wall     Disposition: Discharge  Condition: Good  I have discussed the results, Dx and Tx plan with the pt(& family if present). He/she/they expressed understanding and agree(s) with the plan. Discharge instructions discussed at great length. Strict return precautions discussed and pt &/or family have verbalized understanding of the instructions. No further questions at time of discharge.    Discharge Medication  List as of 12/18/2016  7:38 PM    START taking these medications   Details  !! cephALEXin (KEFLEX) 500 MG capsule Take 1 capsule (500 mg total) by mouth 4 (four) times daily., Starting Mon 12/18/2016, Until Mon 12/25/2016, Print     !! - Potential duplicate medications found. Please discuss with provider.      Follow Up: Determatology, [facility] [address] [place] [address] [phone]     Surgery, [facility] [address] [place] [address] [place] [address] [phone]  [facility] EMERGENCY DEPARTMENT [id number] [place] [address] [phone]  If symptoms worsen     Samariyah Cowles, Canary Brim, MD 12/19/16 1224

## 2016-12-18 NOTE — Discharge Instructions (Signed)
Please take the antibiotics to treat or skin infection. Please use anti-inflammatory medications and over-the-counter pain medicines for the symptoms. Please call to follow-up with either the general surgery team or a dermatologist for reevaluation of your cyst. Please follow-up with your PCP. If any symptoms change or worsen, please return to the nearest emergency department.

## 2016-12-18 NOTE — ED Triage Notes (Signed)
Abscess to her left lateral abdomen.

## 2017-09-05 ENCOUNTER — Emergency Department (HOSPITAL_COMMUNITY)
Admission: EM | Admit: 2017-09-05 | Discharge: 2017-09-05 | Disposition: A | Discharge status: Home / Self Care | Payer: Self-pay | Attending: Emergency Medicine | Admitting: Emergency Medicine

## 2017-09-05 ENCOUNTER — Emergency Department (HOSPITAL_COMMUNITY): Payer: Self-pay

## 2017-09-05 ENCOUNTER — Encounter (HOSPITAL_COMMUNITY): Payer: Self-pay | Admitting: *Deleted

## 2017-09-05 DIAGNOSIS — Z87891 Personal history of nicotine dependence: Secondary | ICD-10-CM | POA: Insufficient documentation

## 2017-09-05 DIAGNOSIS — F909 Attention-deficit hyperactivity disorder, unspecified type: Secondary | ICD-10-CM | POA: Insufficient documentation

## 2017-09-05 DIAGNOSIS — R05 Cough: Secondary | ICD-10-CM | POA: Insufficient documentation

## 2017-09-05 DIAGNOSIS — M7918 Myalgia, other site: Secondary | ICD-10-CM | POA: Insufficient documentation

## 2017-09-05 DIAGNOSIS — J029 Acute pharyngitis, unspecified: Secondary | ICD-10-CM | POA: Insufficient documentation

## 2017-09-05 DIAGNOSIS — R51 Headache: Secondary | ICD-10-CM | POA: Insufficient documentation

## 2017-09-05 DIAGNOSIS — Z79899 Other long term (current) drug therapy: Secondary | ICD-10-CM | POA: Insufficient documentation

## 2017-09-05 DIAGNOSIS — R509 Fever, unspecified: Secondary | ICD-10-CM | POA: Insufficient documentation

## 2017-09-05 LAB — GROUP A STREP BY PCR: GROUP A STREP BY PCR: NOT DETECTED

## 2017-09-05 LAB — POC URINE PREG, ED: Preg Test, Ur: NEGATIVE

## 2017-09-05 MED ORDER — IBUPROFEN 600 MG PO TABS: 600.0000 mg | ORAL_TABLET | Freq: Four times a day (QID) | ORAL | 0 refills | Status: DC | PRN

## 2017-09-05 MED ORDER — CLINDAMYCIN HCL 150 MG PO CAPS
300.0000 mg | ORAL_CAPSULE | Freq: Once | ORAL | Status: AC
  Administered 2017-09-05: 300 mg via ORAL
  Filled 2017-09-05: qty 2

## 2017-09-05 MED ORDER — KETOROLAC TROMETHAMINE 30 MG/ML IJ SOLN
30.0000 mg | Freq: Once | INTRAMUSCULAR | Status: AC
  Administered 2017-09-05: 30 mg via INTRAMUSCULAR
  Filled 2017-09-05: qty 1

## 2017-09-05 MED ORDER — DEXAMETHASONE SODIUM PHOSPHATE 10 MG/ML IJ SOLN
10.0000 mg | Freq: Once | INTRAMUSCULAR | Status: AC
  Administered 2017-09-05: 10 mg via INTRAMUSCULAR
  Filled 2017-09-05: qty 1

## 2017-09-05 MED ORDER — ALBUTEROL SULFATE HFA 108 (90 BASE) MCG/ACT IN AERS
1.0000 | INHALATION_SPRAY | Freq: Four times a day (QID) | RESPIRATORY_TRACT | Status: DC
  Administered 2017-09-05: 1 via RESPIRATORY_TRACT
  Filled 2017-09-05: qty 6.7

## 2017-09-05 MED ORDER — ACETAMINOPHEN 500 MG PO TABS
1000.0000 mg | ORAL_TABLET | Freq: Once | ORAL | Status: AC
  Administered 2017-09-05: 1000 mg via ORAL
  Filled 2017-09-05: qty 2

## 2017-09-05 MED ORDER — CLINDAMYCIN HCL 150 MG PO CAPS
300.0000 mg | ORAL_CAPSULE | Freq: Four times a day (QID) | ORAL | 0 refills | Status: AC
Start: 2017-09-05 — End: 2017-09-12

## 2017-09-05 NOTE — ED Provider Notes (Signed)
[facility] EMERGENCY DEPARTMENT Provider Note   CSN: [id number] Arrival date & time: 09/05/17  1705     History   Chief Complaint Chief Complaint  Patient presents with  . Generalized Body Aches    HPI Brandi Hoffman is a 27 y.o. female.  HPI   Patient is a 27 year old female with a history of ADHD, GERD, presenting for fevers, myalgias, and sore throat.  Patient reports that she began having a sore throat over the past 48 hours, and has had pain with swallowing.  Patient denies any obstructive swallowing, difficulty breathing, neck induration or swelling, lingual swelling, or submandibular tenderness.  Patient also notes that she felt febrile over the past 2 to 3 days.  Patient reports she has had a productive cough for 2 weeks, and had interim improvement, but began having a productive cough again 2 days ago.  Patient also reporting a global headache without visual changes.  Patient denies taking any antipyretics prior to arrival.  Patient does have a history of recurrent strep pharyngitis as an adult.  Past Medical History:  Diagnosis Date  . ADHD (attention deficit hyperactivity disorder)   . GERD (gastroesophageal reflux disease)   . Pregnant   . Scoliosis     Patient Active Problem List   Diagnosis Date Noted  . Acute gangrenous appendicitis 05/06/2015  . NSVD (normal spontaneous vaginal delivery) 05/08/2013  . Normal pregnancy 05/07/2013    Past Surgical History:  Procedure Laterality Date  . LAPAROSCOPIC APPENDECTOMY N/A 05/06/2015   Procedure: APPENDECTOMY LAPAROSCOPIC;  Surgeon: Glenna Fellows, MD;  Location: [facility];  Service: General;  Laterality: N/A;  . MULTIPLE TOOTH EXTRACTIONS       OB History    Gravida  2   Para  2   Term  2   Preterm      AB      Living  2     SAB      TAB      Ectopic      Multiple      Live Births  2            Home Medications    Prior to Admission medications   Medication Sig  Start Date End Date Taking? Authorizing Provider  cephALEXin (KEFLEX) 500 MG capsule Take 1 capsule (500 mg total) by mouth 4 (four) times daily. Patient not taking: Reported on 09/25/2016 06/22/15   Marlon Pel, PA-C  ciprofloxacin (CIPRO) 500 MG tablet Take 1 tablet (500 mg total) by mouth 2 (two) times daily. Patient not taking: Reported on 06/21/2015 05/07/15   Ovidio Kin, MD  HYDROcodone-acetaminophen (NORCO/VICODIN) 5-325 MG tablet Take 1-2 tablets by mouth every 4 (four) hours as needed for moderate pain. Patient not taking: Reported on 09/25/2016 05/07/15   Ovidio Kin, MD  methocarbamol (ROBAXIN) 500 MG tablet Take 1 tablet (500 mg total) by mouth 2 (two) times daily. Patient not taking: Reported on 06/21/2015 04/26/15   Rolland Porter, MD  naproxen (NAPROSYN) 500 MG tablet Take 1 tablet (500 mg total) by mouth 2 (two) times daily. Patient not taking: Reported on 06/21/2015 04/26/15   Rolland Porter, MD    Family History Family History  Adopted: Yes    Social History Social History   Tobacco Use  . Smoking status: Former Games developer  . Smokeless tobacco: Never Used  Substance Use Topics  . Alcohol use: No  . Drug use: No     Allergies   Patient has no known  allergies.   Review of Systems Review of Systems  Constitutional: Positive for chills and fever.  HENT: Positive for congestion, rhinorrhea and sore throat. Negative for dental problem, facial swelling, trouble swallowing and voice change.   Eyes: Negative for visual disturbance.  Respiratory: Positive for cough. Negative for stridor.   Gastrointestinal: Negative for nausea and vomiting.  Skin: Negative for rash.  Neurological: Positive for headaches.     Physical Exam Updated Vital Signs BP 113/62 (BP Location: Right Arm)   Pulse 99   Temp 100.2 F (37.9 C) (Oral)   Resp 20   LMP 08/12/2017   SpO2 100%   Physical Exam  Constitutional: She appears well-developed and well-nourished. No distress.  Sitting  comfortably in bed.  HENT:  Head: Normocephalic and atraumatic.  Mouth/Throat:    Normal phonation. No muffled voice sounds. Patient swallows secretions without difficulty. Dentition normal. No lesions of tongue or buccal mucosa. Uvula midline.  There is asymmetric swelling of the posterior pharynx with swelling of right anterior tonsillar pillar.  Right tonsil slightly greater than left.  +Erythema of posterior pharynx. No tonsillar exuduate. No lingual swelling. No induration inferior to tongue. No submandibular tenderness, swelling, or induration.  Tissues of the neck supple. cervical lymphadenopathy. Right TM without erythema or effusion; left TM without erythema or effusion.   Eyes: Conjunctivae and EOM are normal. Right eye exhibits no discharge. Left eye exhibits no discharge.  EOMs normal to gross examination.  Neck: Normal range of motion. Neck supple.  Cardiovascular: Normal rate, regular rhythm and normal heart sounds.  Pulmonary/Chest: Effort normal and breath sounds normal. No respiratory distress. She has no wheezes. She has no rales.  Normal respiratory effort. Patient converses comfortably. No audible wheeze or stridor.  Abdominal: She exhibits no distension.  Musculoskeletal: Normal range of motion.  Lymphadenopathy:    She has no cervical adenopathy.  Neurological: She is alert.  Cranial nerves intact to gross observation. Patient moves extremities without difficulty.  Skin: Skin is warm and dry. She is not diaphoretic.  Psychiatric: She has a normal mood and affect. Her behavior is normal. Judgment and thought content normal.  Nursing note and vitals reviewed.    ED Treatments / Results  Labs (all labs ordered are listed, but only abnormal results are displayed) Labs Reviewed  GROUP A STREP BY PCR  POC URINE PREG, ED    EKG None  Radiology No results found.  Procedures Procedures (including critical care time)  Medications Ordered in ED Medications    dexamethasone (DECADRON) injection 10 mg (has no administration in time range)  ketorolac (TORADOL) 30 MG/ML injection 30 mg (has no administration in time range)  acetaminophen (TYLENOL) tablet 1,000 mg (1,000 mg Oral Given 09/05/17 1935)     Initial Impression / Assessment and Plan / ED Course  I have reviewed the triage vital signs and the nursing notes.  Pertinent labs & imaging results that were available during my care of the patient were reviewed by me and considered in my medical decision making (see chart for details).  Clinical Course as of Sep 05 1940  Wed Sep 05, 2017  1942 Spoke with Dr. Doran Heater of ENT, who is in agreement with the plan to place on clindamycin and follow-up in the office to ensure clinical improvement.   [AM]    Clinical Course User Index [AM] Elisha Ponder, PA-C    Patient nontoxic-appearing, but febrile up to 102 my examination.  Patient is tolerating secretions without evidence of  airway compromise.  Swelling of right anterior tonsillar pillar and asymmetric swelling of the tonsils without deviation of uvula suggestive of early peritonsillar abscess.  No evidence of deep space infection of the head or neck.  Clinical findings were discussed with Dr. Doran Heater who is in agreement with antibiotic therapy and follow-up.  Chest x-ray negative for infiltrate.  Patient with significant symptomatic improvement after Toradol, Decadron.  Patient is tolerating secretions and passed p.o. challenge.  Patient instructed to initiate clindamycin therapy, and follow-up with Dr. Doran Heater by Friday.  Patient can return precautions for any increasing swelling of the posterior pharynx, semitubular tenderness, neck induration, difficulty breathing, difficulty swallowing.  Patient is in understanding and agrees with the plan of care.  This is a supervised visit with Dr. Alvira Monday. Evaluation, management, and discharge planning discussed with this attending  physician.  Final Clinical Impressions(s) / ED Diagnoses   Final diagnoses:  Sore throat    ED Discharge Orders        Ordered    clindamycin (CLEOCIN) 150 MG capsule  Every 6 hours     09/05/17 2056    ibuprofen (ADVIL,MOTRIN) 600 MG tablet  Every 6 hours PRN     09/05/17 2056       Elisha Ponder, PA-C 09/06/17 1610    Alvira Monday, MD 09/08/17 1232

## 2017-09-05 NOTE — ED Triage Notes (Signed)
Pt in c/o body aches, intermittent fever, and sore throat for the last two days, has not taken any medications PTA

## 2017-09-05 NOTE — Discharge Instructions (Signed)
Please read and follow all provided instructions.  Your diagnoses today include:  1. Sore throat    Your strep test was negative today, but we are concerned that you are developing an early peritonsillar abscess, which is a fluid collection in the right tonsil.  This may respond to antibiotics, but is important to follow-up with ear nose and throat.  Tests performed today include: Strep test: was negative for strep throat Strep culture: you will be notified if this comes back positive Vital signs. See below for your results today.   Medications prescribed:   Please take all of your antibiotics until finished.   You may develop abdominal discomfort or nausea from the antibiotic. If this occurs, you may take it with food. Some patients also get diarrhea with antibiotics. You may help offset this with probiotics which you can buy or get in yogurt. Do not eat or take the probiotics until 2 hours after your antibiotic.  Culturelle is a brand of probiotics that she may purchase.  Some women develop vaginal yeast infections after antibiotics. If you develop unusual vaginal discharge after being on this medication, please see your primary care provider.   Some people develop allergies to antibiotics. Symptoms of antibiotic allergy can be mild and include a flat rash and itching. They can also be more serious and include:  ?Hives - Hives are raised, red patches of skin that are usually very itchy.  ?Lip or tongue swelling  ?Trouble swallowing or breathing  ?Blistering of the skin or mouth.  If you have any of these serious symptoms, please seek emergency medical care immediately.  Home care instructions:  Please read the educational materials provided and follow any instructions contained in this packet.  Follow-up instructions: Please follow-up with your primary care provider as needed for further evaluation of your symptoms.  Return instructions:  Please return to the Emergency Department  if you experience worsening symptoms.  Return if you have worsening problems swallowing, your neck becomes swollen, you cannot swallow your saliva or your voice becomes muffled.  Return with high persistent fever, persistent vomiting, or if you have trouble breathing.  Please return if you have any other emergent concerns.  Additional Information:  Your vital signs today were: BP 113/62 (BP Location: Right Arm)    Pulse 99    Temp 100.2 F (37.9 C) (Oral)    Resp 20    LMP 08/12/2017    SpO2 100%  If your blood pressure (BP) was elevated above 135/85 this visit, please have this repeated by your doctor within one month. --------------

## 2017-09-05 NOTE — ED Notes (Signed)
Patient transported to X-ray 

## 2017-09-16 ENCOUNTER — Emergency Department (HOSPITAL_COMMUNITY): Payer: Self-pay

## 2017-09-16 ENCOUNTER — Emergency Department (HOSPITAL_COMMUNITY)
Admission: EM | Admit: 2017-09-16 | Discharge: 2017-09-16 | Disposition: A | Discharge status: Home / Self Care | Payer: Self-pay | Attending: Emergency Medicine | Admitting: Emergency Medicine

## 2017-09-16 ENCOUNTER — Encounter (HOSPITAL_COMMUNITY): Payer: Self-pay

## 2017-09-16 ENCOUNTER — Other Ambulatory Visit: Payer: Self-pay

## 2017-09-16 DIAGNOSIS — Z87891 Personal history of nicotine dependence: Secondary | ICD-10-CM | POA: Insufficient documentation

## 2017-09-16 DIAGNOSIS — R509 Fever, unspecified: Secondary | ICD-10-CM | POA: Insufficient documentation

## 2017-09-16 DIAGNOSIS — J039 Acute tonsillitis, unspecified: Secondary | ICD-10-CM | POA: Insufficient documentation

## 2017-09-16 DIAGNOSIS — R51 Headache: Secondary | ICD-10-CM | POA: Insufficient documentation

## 2017-09-16 DIAGNOSIS — R6883 Chills (without fever): Secondary | ICD-10-CM | POA: Insufficient documentation

## 2017-09-16 LAB — CBC WITH DIFFERENTIAL/PLATELET
BASOS PCT: 0 %
Basophils Absolute: 0 10*3/uL (ref 0.0–0.1)
Eosinophils Absolute: 0.1 10*3/uL (ref 0.0–0.7)
Eosinophils Relative: 1 %
HEMATOCRIT: 41.9 % (ref 36.0–46.0)
Hemoglobin: 13.8 g/dL (ref 12.0–15.0)
LYMPHS PCT: 6 %
Lymphs Abs: 1.3 10*3/uL (ref 0.7–4.0)
MCH: 30.1 pg (ref 26.0–34.0)
MCHC: 32.9 g/dL (ref 30.0–36.0)
MCV: 91.3 fL (ref 78.0–100.0)
MONO ABS: 1.1 10*3/uL — AB (ref 0.1–1.0)
MONOS PCT: 5 %
NEUTROS ABS: 21.4 10*3/uL — AB (ref 1.7–7.7)
NEUTROS PCT: 88 %
Platelets: 320 10*3/uL (ref 150–400)
RBC: 4.59 MIL/uL (ref 3.87–5.11)
RDW: 12.9 % (ref 11.5–15.5)
WBC: 23.9 10*3/uL — ABNORMAL HIGH (ref 4.0–10.5)

## 2017-09-16 LAB — BASIC METABOLIC PANEL
ANION GAP: 8 (ref 5–15)
BUN: 5 mg/dL — ABNORMAL LOW (ref 6–20)
CHLORIDE: 107 mmol/L (ref 101–111)
CO2: 23 mmol/L (ref 22–32)
Calcium: 9 mg/dL (ref 8.9–10.3)
Creatinine, Ser: 0.75 mg/dL (ref 0.44–1.00)
GFR calc non Af Amer: >60 mL/min (ref >60)
GLUCOSE: 107 mg/dL — AB (ref 65–99)
POTASSIUM: 3.5 mmol/L (ref 3.5–5.1)
Sodium: 138 mmol/L (ref 135–145)

## 2017-09-16 LAB — I-STAT BETA HCG BLOOD, ED (MC, WL, AP ONLY): HCG, QUANTITATIVE: 8.8 m[IU]/mL — AB (ref <5)

## 2017-09-16 LAB — MONONUCLEOSIS SCREEN: Mono Screen: NEGATIVE

## 2017-09-16 MED ORDER — PREDNISONE 20 MG PO TABS: 40.0000 mg | ORAL_TABLET | Freq: Every day | ORAL | 0 refills | Status: DC

## 2017-09-16 MED ORDER — KETOROLAC TROMETHAMINE 30 MG/ML IJ SOLN
15.0000 mg | Freq: Once | INTRAMUSCULAR | Status: AC
  Administered 2017-09-16: 15 mg via INTRAVENOUS
  Filled 2017-09-16: qty 1

## 2017-09-16 MED ORDER — SODIUM CHLORIDE 0.9 % IV BOLUS
1000.0000 mL | Freq: Once | INTRAVENOUS | Status: AC
  Administered 2017-09-16: 1000 mL via INTRAVENOUS

## 2017-09-16 MED ORDER — IOHEXOL 300 MG/ML  SOLN
100.0000 mL | Freq: Once | INTRAMUSCULAR | Status: AC | PRN
  Administered 2017-09-16: 100 mL via INTRAVENOUS

## 2017-09-16 MED ORDER — DEXAMETHASONE 1 MG/ML PO CONC
10.0000 mg | Freq: Once | ORAL | Status: AC
  Administered 2017-09-16: 10 mg via ORAL
  Filled 2017-09-16 (×2): qty 10

## 2017-09-16 MED ORDER — AMOXICILLIN-POT CLAVULANATE 875-125 MG PO TABS: 1.0000 | ORAL_TABLET | Freq: Two times a day (BID) | ORAL | 0 refills | Status: DC

## 2017-09-16 NOTE — ED Triage Notes (Signed)
Patient complains of ongoing cough and congestion, seen last week and treated for pneumonia. Patient concerned that her throat still hurts and thinks abscess to same worse, no trouble swalloweing

## 2017-09-16 NOTE — Discharge Instructions (Addendum)
Please take Augmentin twice a day for the next week Take Prednisone for the next 5 days. You have received a dose today. Please start this medicine tomorrow Establish care with a primary doctor - a resource list has been provided

## 2017-09-16 NOTE — ED Provider Notes (Signed)
[facility] EMERGENCY DEPARTMENT Provider Note   CSN: [id number] Arrival date & time: 09/16/17  1020     History   Chief Complaint Chief Complaint  Patient presents with  . Sore Throat    HPI Jaylynne Birkhead is a 27 y.o. female who presents with fever, chills, sore throat.  She states that she was here several weeks ago for the same symptoms.  She was advised to follow-up with ENT however she states she is uninsured and she went out of town and was unable to do that.  She did take the full course of clindamycin.  She returns today because she feels like her symptoms are worsening.  She has difficulty and painful swallowing and is coughing up yellowish-green mucous. She had a fever of 102 last night.  She has ongoing chills, headache, sore throat.  She feels like the right side is worse than the left. She is able to swallow but it's very painful. She tested negative for strep and had a negative chest x-ray on 5/1 when she was last here. No abdominal pain, N/VD, dysuria.  HPI  Past Medical History:  Diagnosis Date  . ADHD (attention deficit hyperactivity disorder)   . GERD (gastroesophageal reflux disease)   . Pregnant   . Scoliosis     Patient Active Problem List   Diagnosis Date Noted  . Acute gangrenous appendicitis 05/06/2015  . NSVD (normal spontaneous vaginal delivery) 05/08/2013  . Normal pregnancy 05/07/2013    Past Surgical History:  Procedure Laterality Date  . LAPAROSCOPIC APPENDECTOMY N/A 05/06/2015   Procedure: APPENDECTOMY LAPAROSCOPIC;  Surgeon: Glenna Fellows, MD;  Location: [facility]S;  Service: General;  Laterality: N/A;  . MULTIPLE TOOTH EXTRACTIONS       OB History    Gravida  2   Para  2   Term  2   Preterm      AB      Living  2     SAB      TAB      Ectopic      Multiple      Live Births  2            Home Medications    Prior to Admission medications   Medication Sig Start Date End Date Taking?  Authorizing Provider  cephALEXin (KEFLEX) 500 MG capsule Take 1 capsule (500 mg total) by mouth 4 (four) times daily. Patient not taking: Reported on 09/25/2016 06/22/15   Marlon Pel, PA-C  ciprofloxacin (CIPRO) 500 MG tablet Take 1 tablet (500 mg total) by mouth 2 (two) times daily. Patient not taking: Reported on 06/21/2015 05/07/15   Ovidio Kin, MD  HYDROcodone-acetaminophen (NORCO/VICODIN) 5-325 MG tablet Take 1-2 tablets by mouth every 4 (four) hours as needed for moderate pain. Patient not taking: Reported on 09/25/2016 05/07/15   Ovidio Kin, MD  ibuprofen (ADVIL,MOTRIN) 600 MG tablet Take 1 tablet (600 mg total) by mouth every 6 (six) hours as needed. 09/05/17   Aviva Kluver B, PA-C  methocarbamol (ROBAXIN) 500 MG tablet Take 1 tablet (500 mg total) by mouth 2 (two) times daily. Patient not taking: Reported on 06/21/2015 04/26/15   Rolland Porter, MD  naproxen (NAPROSYN) 500 MG tablet Take 1 tablet (500 mg total) by mouth 2 (two) times daily. Patient not taking: Reported on 06/21/2015 04/26/15   Rolland Porter, MD    Family History Family History  Adopted: Yes    Social History Social History   Tobacco Use  .  Smoking status: Former Games developer  . Smokeless tobacco: Never Used  Substance Use Topics  . Alcohol use: No  . Drug use: No     Allergies   Patient has no known allergies.   Review of Systems Review of Systems  Constitutional: Positive for appetite change, chills and fever.  HENT: Positive for rhinorrhea and sore throat. Negative for drooling and ear pain.   Respiratory: Negative for shortness of breath.   Cardiovascular: Negative for chest pain.  Gastrointestinal: Negative for abdominal pain, diarrhea, nausea and vomiting.  Neurological: Positive for headaches.  All other systems reviewed and are negative.    Physical Exam Updated Vital Signs BP 109/86 (BP Location: Right Arm)   Pulse (!) 120   Temp 98.9 F (37.2 C) (Oral)   Resp 18   SpO2 98%    Physical Exam  Constitutional: She is oriented to person, place, and time. She appears well-developed and well-nourished. No distress.  Cooperative. Shivering in bed  HENT:  Head: Normocephalic and atraumatic.  Right Ear: Tympanic membrane normal.  Left Ear: Tympanic membrane normal.  Nose: Rhinorrhea present.  Mouth/Throat: Mucous membranes are normal. No trismus in the jaw. Posterior oropharyngeal edema and posterior oropharyngeal erythema present. No oropharyngeal exudate. Tonsils are 2+ on the right. Tonsils are 2+ on the left.  Eyes: Pupils are equal, round, and reactive to light. Conjunctivae are normal. Right eye exhibits no discharge. Left eye exhibits no discharge. No scleral icterus.  Neck: Normal range of motion.  Cardiovascular: Tachycardia present.  Pulmonary/Chest: Effort normal and breath sounds normal. No respiratory distress.  Abdominal: Soft. Bowel sounds are normal. She exhibits no distension. There is no tenderness.  Neurological: She is alert and oriented to person, place, and time.  Skin: Skin is warm and dry.  Psychiatric: She has a normal mood and affect. Her behavior is normal.  Nursing note and vitals reviewed.    ED Treatments / Results  Labs (all labs ordered are listed, but only abnormal results are displayed) Labs Reviewed  BASIC METABOLIC PANEL - Abnormal; Notable for the following components:      Result Value   Glucose, Bld 107 (*)    BUN 5 (*)    All other components within normal limits  CBC WITH DIFFERENTIAL/PLATELET - Abnormal; Notable for the following components:   WBC 23.9 (*)    Neutro Abs 21.4 (*)    Monocytes Absolute 1.1 (*)    All other components within normal limits  I-STAT BETA HCG BLOOD, ED ([facility], [facility], [facility] ONLY) - Abnormal; Notable for the following components:   I-stat hCG, quantitative 8.8 (*)    All other components within normal limits  MONONUCLEOSIS SCREEN    EKG None  Radiology Ct Soft Tissue Neck W Contrast  Result  Date: 09/16/2017 CLINICAL DATA:  27 year old female with sore throat for 3 weeks. Tonsillitis. EXAM: CT NECK WITH CONTRAST TECHNIQUE: Multidetector CT imaging of the neck was performed using the standard protocol following the bolus administration of intravenous contrast. CONTRAST:  OMNIPAQUE IOHEXOL 300 MG/ML  SOLN COMPARISON:  None. FINDINGS: Pharynx and larynx: Normal larynx.  The epiglottis is normal. There is moderate generalized enlargement of all pharyngeal tonsillar tissue including the lingual tonsils, bilateral palatine tonsils, and adenoids. The enlarged tonsillar tissue is symmetric and homogeneously enhancing. No tonsillar fluid collection identified. The parapharyngeal and retropharyngeal spaces remain normal. Salivary glands: Negative sublingual space. The bilateral submandibular glands and parotid glands appear normal. Thyroid: Negative. Lymph nodes: Mildly enlarged reactive appearing  bilateral level 2/level 3 lymph nodes individually measuring up to 12 millimeters short axis. No cystic or necrotic nodes. There is associated mild bilateral retropharyngeal lymphadenopathy (series 3, image 27 on the left). The remaining nodal stations are spared. Vascular: The major vascular structures in the neck and at the skull base are patent including both internal jugular veins. Limited intracranial: Negative. Visualized orbits: Negative. Mastoids and visualized paranasal sinuses: Mild to moderate scattered bilateral ethmoid sinus mucosal thickening with some bubbly opacity. Symmetric nasal cavity mucosal thickening with retained secretions. The other visible paranasal sinuses are clear. The tympanic cavities and mastoids are clear. Skeleton: Negative. Upper chest: Negative. Normal superior mediastinum. The visible lungs are clear. The visible axillary lymph nodes appear normal. IMPRESSION: 1. Symmetric generalized tonsillar enlargement throughout the pharynx compatible with Acute Tonsillitis. No tonsillar  abscess or complicating features. 2. Reactive lymphadenopathy at the bilateral retropharyngeal, level 2, and level 3 nodal stations. No suppurated nodes. 3. Nasal and ethmoid mucosal thickening and secretions compatible with associated Rhinosinusitis. Electronically Signed   By: Odessa Fleming M.D.   On: 09/16/2017 14:19    Procedures Procedures (including critical care time)  Medications Ordered in ED Medications  sodium chloride 0.9 % bolus 1,000 mL (0 mLs Intravenous Stopped 09/16/17 1403)  ketorolac (TORADOL) 30 MG/ML injection 15 mg (15 mg Intravenous Given 09/16/17 1237)  dexamethasone (DECADRON) 1 MG/ML solution 10 mg (10 mg Oral Given 09/16/17 1412)  iohexol (OMNIPAQUE) 300 MG/ML solution 100 mL (100 mLs Intravenous Contrast Given 09/16/17 1324)     Initial Impression / Assessment and Plan / ED Course  I have reviewed the triage vital signs and the nursing notes.  Pertinent labs & imaging results that were available during my care of the patient were reviewed by me and considered in my medical decision making (see chart for details).  28 year old female presents with ongoing sore throat and painful swallowing.  She is tachycardic but otherwise vital signs are normal.  On exam she has edematous and inflamed appearing oropharynx which appears symmetric.  Work-up reviewed during prior visit.  Her chest x-ray and strep test was negative.  She states she completed a course of clindamycin after which she improved for couple of days but then worsened.  Will obtain lab work, give fluids, Toradol, steroids, and obtain CT of soft tissue of the neck.  CT is remarkable for generalized tonsillitis with reactive lymph nodes.  There is no abscess collection.  Her tachycardia has improved after fluids and Toradol.  She is tolerating p.o.  Discussed with Dr. Lynelle Doctor.  Will have her take Augmentin for 1 week and encouraged her to establish care with a primary care provider.  She verbalized understanding.  Final  Clinical Impressions(s) / ED Diagnoses   Final diagnoses:  Tonsillitis    ED Discharge Orders    None       Beryle Quant 09/16/17 1505    Linwood Dibbles, MD 09/18/17 [phone]

## 2018-01-30 ENCOUNTER — Emergency Department (HOSPITAL_COMMUNITY): Payer: Self-pay

## 2018-01-30 ENCOUNTER — Encounter (HOSPITAL_COMMUNITY): Payer: Self-pay | Admitting: Neurology

## 2018-01-30 ENCOUNTER — Emergency Department (HOSPITAL_COMMUNITY)
Admission: EM | Admit: 2018-01-30 | Discharge: 2018-01-30 | Disposition: A | Discharge status: Home / Self Care | Payer: Self-pay | Attending: Emergency Medicine | Admitting: Emergency Medicine

## 2018-01-30 DIAGNOSIS — Z79899 Other long term (current) drug therapy: Secondary | ICD-10-CM | POA: Insufficient documentation

## 2018-01-30 DIAGNOSIS — R102 Pelvic and perineal pain: Secondary | ICD-10-CM

## 2018-01-30 DIAGNOSIS — F909 Attention-deficit hyperactivity disorder, unspecified type: Secondary | ICD-10-CM | POA: Insufficient documentation

## 2018-01-30 DIAGNOSIS — N23 Unspecified renal colic: Secondary | ICD-10-CM | POA: Insufficient documentation

## 2018-01-30 DIAGNOSIS — Z87891 Personal history of nicotine dependence: Secondary | ICD-10-CM | POA: Insufficient documentation

## 2018-01-30 LAB — CBC WITH DIFFERENTIAL/PLATELET
ABS IMMATURE GRANULOCYTES: 0.1 10*3/uL (ref 0.0–0.1)
BASOS ABS: 0.1 10*3/uL (ref 0.0–0.1)
BASOS PCT: 1 %
EOS PCT: 2 %
Eosinophils Absolute: 0.2 10*3/uL (ref 0.0–0.7)
HCT: 42.5 % (ref 36.0–46.0)
HEMOGLOBIN: 13.7 g/dL (ref 12.0–15.0)
Immature Granulocytes: 1 %
LYMPHS PCT: 27 %
Lymphs Abs: 2.7 10*3/uL (ref 0.7–4.0)
MCH: 29.6 pg (ref 26.0–34.0)
MCHC: 32.2 g/dL (ref 30.0–36.0)
MCV: 91.8 fL (ref 78.0–100.0)
Monocytes Absolute: 0.6 10*3/uL (ref 0.1–1.0)
Monocytes Relative: 6 %
NEUTROS ABS: 6.4 10*3/uL (ref 1.7–7.7)
Neutrophils Relative %: 63 %
PLATELETS: 312 10*3/uL (ref 150–400)
RBC: 4.63 MIL/uL (ref 3.87–5.11)
RDW: 12.2 % (ref 11.5–15.5)
WBC: 10.1 10*3/uL (ref 4.0–10.5)

## 2018-01-30 LAB — I-STAT CHEM 8, ED
BUN: 5 mg/dL — AB (ref 6–20)
CALCIUM ION: 1.17 mmol/L (ref 1.15–1.40)
Chloride: 107 mmol/L (ref 98–111)
Creatinine, Ser: 0.7 mg/dL (ref 0.44–1.00)
Glucose, Bld: 127 mg/dL — ABNORMAL HIGH (ref 70–99)
HEMATOCRIT: 41 % (ref 36.0–46.0)
Hemoglobin: 13.9 g/dL (ref 12.0–15.0)
Potassium: 4.1 mmol/L (ref 3.5–5.1)
SODIUM: 141 mmol/L (ref 135–145)
TCO2: 22 mmol/L (ref 22–32)

## 2018-01-30 LAB — I-STAT BETA HCG BLOOD, ED (MC, WL, AP ONLY)

## 2018-01-30 MED ORDER — IBUPROFEN 400 MG PO TABS: 400.0000 mg | ORAL_TABLET | Freq: Four times a day (QID) | ORAL | 0 refills | Status: DC | PRN

## 2018-01-30 MED ORDER — ONDANSETRON 8 MG PO TBDP: 8.0000 mg | ORAL_TABLET | Freq: Three times a day (TID) | ORAL | 0 refills | Status: DC | PRN

## 2018-01-30 MED ORDER — ONDANSETRON HCL 4 MG/2ML IJ SOLN
4.0000 mg | Freq: Once | INTRAMUSCULAR | Status: AC
  Administered 2018-01-30: 4 mg via INTRAVENOUS
  Filled 2018-01-30: qty 2

## 2018-01-30 MED ORDER — FENTANYL CITRATE (PF) 100 MCG/2ML IJ SOLN
100.0000 ug | Freq: Once | INTRAMUSCULAR | Status: AC
  Administered 2018-01-30: 100 ug via INTRAVENOUS
  Filled 2018-01-30: qty 2

## 2018-01-30 MED ORDER — KETOROLAC TROMETHAMINE 15 MG/ML IJ SOLN
15.0000 mg | Freq: Once | INTRAMUSCULAR | Status: AC
  Administered 2018-01-30: 15 mg via INTRAVENOUS
  Filled 2018-01-30: qty 1

## 2018-01-30 MED ORDER — HYDROCODONE-ACETAMINOPHEN 5-325 MG PO TABS: 1.0000 | ORAL_TABLET | Freq: Three times a day (TID) | ORAL | 0 refills | Status: AC | PRN

## 2018-01-30 MED ORDER — ACETAMINOPHEN 325 MG PO TABS: 650.0000 mg | ORAL_TABLET | Freq: Four times a day (QID) | ORAL | 0 refills | Status: DC | PRN

## 2018-01-30 NOTE — ED Notes (Signed)
Patient transported to Ultrasound 

## 2018-01-30 NOTE — Discharge Instructions (Signed)
We saw you in the ER for the abdominal pain. °Our results indicate that you have a kidney stone. °We were able to get your pain is relative control, and we can safely send you home. ° °Take the meds prescribed. °Set up an appointment with the Urologist. °If the pain is unbearable, you start having fevers, chills, and are unable to keep any meds down - then return to the ER. °  °

## 2018-01-30 NOTE — ED Provider Notes (Addendum)
[facility] EMERGENCY DEPARTMENT Provider Note   CSN: [id number] Arrival date & time: 01/30/18  0801     History   Chief Complaint Chief Complaint  Patient presents with  . Abdominal Pain    HPI Brandi Hoffman is a 27 y.o. female.  HPI  27 y/o F with sudden onset LLQ abd pain. Pt is s/p appendectomy. Pain is constant, severe and woke her up from sleep. Pt has had associated nausea, emesis and diarrhea. Patient has no pain with urination, blood in the urine, or frequent urination. Pt also denies any vaginal discharge or bleeding. LMP unknown.  Patient has history of kidney stones but states that this pain is worse than any pain she has had including labor.  Past Medical History:  Diagnosis Date  . ADHD (attention deficit hyperactivity disorder)   . GERD (gastroesophageal reflux disease)   . Pregnant   . Scoliosis     Patient Active Problem List   Diagnosis Date Noted  . Acute gangrenous appendicitis 05/06/2015  . NSVD (normal spontaneous vaginal delivery) 05/08/2013  . Normal pregnancy 05/07/2013    Past Surgical History:  Procedure Laterality Date  . LAPAROSCOPIC APPENDECTOMY N/A 05/06/2015   Procedure: APPENDECTOMY LAPAROSCOPIC;  Surgeon: Glenna Fellows, MD;  Location: [facility];  Service: General;  Laterality: N/A;  . MULTIPLE TOOTH EXTRACTIONS       OB History    Gravida  2   Para  2   Term  2   Preterm      AB      Living  2     SAB      TAB      Ectopic      Multiple      Live Births  2            Home Medications    Prior to Admission medications   Medication Sig Start Date End Date Taking? Authorizing Provider  acetaminophen (TYLENOL) 325 MG tablet Take 2 tablets (650 mg total) by mouth every 6 (six) hours as needed. 01/30/18   Derwood Kaplan, MD  amoxicillin-clavulanate (AUGMENTIN) 875-125 MG tablet Take 1 tablet by mouth every 12 (twelve) hours. 09/16/17   Bethel Born, PA-C  HYDROcodone-acetaminophen  (NORCO/VICODIN) 5-325 MG tablet Take 1 tablet by mouth every 8 (eight) hours as needed for up to 3 days for severe pain. 01/30/18 02/02/18  Derwood Kaplan, MD  ibuprofen (ADVIL,MOTRIN) 400 MG tablet Take 1 tablet (400 mg total) by mouth every 6 (six) hours as needed. 01/30/18   Derwood Kaplan, MD  ondansetron (ZOFRAN ODT) 8 MG disintegrating tablet Take 1 tablet (8 mg total) by mouth every 8 (eight) hours as needed for nausea. 01/30/18   Derwood Kaplan, MD  predniSONE (DELTASONE) 20 MG tablet Take 2 tablets (40 mg total) by mouth daily. 09/16/17   Bethel Born, PA-C    Family History Family History  Adopted: Yes    Social History Social History   Tobacco Use  . Smoking status: Former Games developer  . Smokeless tobacco: Never Used  Substance Use Topics  . Alcohol use: No  . Drug use: No     Allergies   Patient has no known allergies.   Review of Systems Review of Systems  Constitutional: Positive for activity change.  Respiratory: Negative for shortness of breath.   Cardiovascular: Negative for chest pain.  Gastrointestinal: Positive for abdominal pain.  Genitourinary: Negative for vaginal bleeding.     Physical Exam Updated Vital Signs  BP 112/72   Pulse 82   Temp 97.8 F (36.6 C) (Oral)   Resp 20   Ht 5\' 5"  (1.651 m)   Wt 77.1 kg   LMP  (Approximate) Comment: July 2019  SpO2 91%   BMI 28.29 kg/m   Physical Exam  Constitutional: She is oriented to person, place, and time. She appears well-developed. She appears distressed.  HENT:  Head: Normocephalic and atraumatic.  Eyes: EOM are normal.  Neck: Normal range of motion. Neck supple.  Cardiovascular:  tachycardia  Pulmonary/Chest: Effort normal.  Abdominal: Bowel sounds are normal. There is tenderness in the left lower quadrant.  Neurological: She is alert and oriented to person, place, and time.  Skin: Skin is warm and dry.  Nursing note and vitals reviewed.    ED Treatments / Results  Labs (all labs  ordered are listed, but only abnormal results are displayed) Labs Reviewed  I-STAT CHEM 8, ED - Abnormal; Notable for the following components:      Result Value   BUN 5 (*)    Glucose, Bld 127 (*)    All other components within normal limits  CBC WITH DIFFERENTIAL/PLATELET  I-STAT BETA HCG BLOOD, ED ([facility], [facility], [facility] ONLY)    EKG None  Radiology US Pelvis Transvanginal Non-ob (tv Only)  Result Date: 01/30/2018 CLINICAL DATA:  Pelvic pain EXAM: TRANSABDOMINAL AND TRANSVAGINAL ULTRASOUND OF PELVIS DOPPLER ULTRASOUND OF OVARIES TECHNIQUE: Both transabdominal and transvaginal ultrasound examinations of the pelvis were performed. Transabdominal technique was performed for global imaging of the pelvis including uterus, ovaries, adnexal regions, and pelvic cul-de-sac. It was necessary to proceed with endovaginal exam following the transabdominal exam to visualize the ovaries. Color and duplex Doppler ultrasound was utilized to evaluate blood flow to the ovaries. COMPARISON:  None. FINDINGS: Uterus Measurements: 7.1 x 4.6 x 4.4 cm. No fibroids or other mass visualized. Endometrium Thickness: 12.2 mm.  No focal abnormality visualized. Right ovary Measurements: 4.4 x 1.9 x 1.6 cm. Normal appearance/no adnexal mass. Left ovary Measurements: 3.7 x 1.6 x 1.9 cm. Normal appearance/no adnexal mass. Pulsed Doppler evaluation of both ovaries demonstrates normal low-resistance arterial and venous waveforms. Other findings Small amount of free fluid is identified in the pelvis. IMPRESSION: No acute abnormality identified.  No evidence of ovarian torsion. Small amount of free fluid identified in the pelvis. Electronically Signed   By: Sherian Rein M.D.   On: 01/30/2018 11:08   US Pelvis (transabdominal Only)  Result Date: 01/30/2018 CLINICAL DATA:  Pelvic pain EXAM: TRANSABDOMINAL AND TRANSVAGINAL ULTRASOUND OF PELVIS DOPPLER ULTRASOUND OF OVARIES TECHNIQUE: Both transabdominal and transvaginal ultrasound examinations  of the pelvis were performed. Transabdominal technique was performed for global imaging of the pelvis including uterus, ovaries, adnexal regions, and pelvic cul-de-sac. It was necessary to proceed with endovaginal exam following the transabdominal exam to visualize the ovaries. Color and duplex Doppler ultrasound was utilized to evaluate blood flow to the ovaries. COMPARISON:  None. FINDINGS: Uterus Measurements: 7.1 x 4.6 x 4.4 cm. No fibroids or other mass visualized. Endometrium Thickness: 12.2 mm.  No focal abnormality visualized. Right ovary Measurements: 4.4 x 1.9 x 1.6 cm. Normal appearance/no adnexal mass. Left ovary Measurements: 3.7 x 1.6 x 1.9 cm. Normal appearance/no adnexal mass. Pulsed Doppler evaluation of both ovaries demonstrates normal low-resistance arterial and venous waveforms. Other findings Small amount of free fluid is identified in the pelvis. IMPRESSION: No acute abnormality identified.  No evidence of ovarian torsion. Small amount of free fluid identified in the pelvis.  Electronically Signed   By: Sherian Rein M.D.   On: 01/30/2018 11:08   US Renal  Result Date: 01/30/2018 CLINICAL DATA:  Left-sided flank pain EXAM: RENAL / URINARY TRACT ULTRASOUND COMPLETE COMPARISON:  09/25/2016 FINDINGS: Right Kidney: Length: 9.8 cm. A few tiny echogenicities are noted which may correspond to the tiny stone seen on prior CT examination. Left Kidney: Length: 10.8 cm. Minimal fullness of the left renal collecting system is noted. Ureter appears within normal limits. Bladder: Appears normal for degree of bladder distention. IMPRESSION: Minimal fullness of the left renal pelvis. Possibility of more distal tiny stone would deserve consideration. CT urography may be helpful. Tiny echogenicities in the lower pole of the right kidney likely corresponding to the tiny stones seen on prior CT. Electronically Signed   By: Alcide Clever M.D.   On: 01/30/2018 11:29   US Pelvic Doppler (torsion R/o Or Mass  Arterial Flow)  Result Date: 01/30/2018 CLINICAL DATA:  Pelvic pain EXAM: TRANSABDOMINAL AND TRANSVAGINAL ULTRASOUND OF PELVIS DOPPLER ULTRASOUND OF OVARIES TECHNIQUE: Both transabdominal and transvaginal ultrasound examinations of the pelvis were performed. Transabdominal technique was performed for global imaging of the pelvis including uterus, ovaries, adnexal regions, and pelvic cul-de-sac. It was necessary to proceed with endovaginal exam following the transabdominal exam to visualize the ovaries. Color and duplex Doppler ultrasound was utilized to evaluate blood flow to the ovaries. COMPARISON:  None. FINDINGS: Uterus Measurements: 7.1 x 4.6 x 4.4 cm. No fibroids or other mass visualized. Endometrium Thickness: 12.2 mm.  No focal abnormality visualized. Right ovary Measurements: 4.4 x 1.9 x 1.6 cm. Normal appearance/no adnexal mass. Left ovary Measurements: 3.7 x 1.6 x 1.9 cm. Normal appearance/no adnexal mass. Pulsed Doppler evaluation of both ovaries demonstrates normal low-resistance arterial and venous waveforms. Other findings Small amount of free fluid is identified in the pelvis. IMPRESSION: No acute abnormality identified.  No evidence of ovarian torsion. Small amount of free fluid identified in the pelvis. Electronically Signed   By: Sherian Rein M.D.   On: 01/30/2018 11:08    Procedures Procedures (including critical care time)  Medications Ordered in ED Medications  ondansetron (ZOFRAN) injection 4 mg (4 mg Intravenous Given 01/30/18 0839)  fentaNYL (SUBLIMAZE) injection 100 mcg (100 mcg Intravenous Given 01/30/18 0839)  ketorolac (TORADOL) 15 MG/ML injection 15 mg (15 mg Intravenous Given 01/30/18 1020)     Initial Impression / Assessment and Plan / ED Course  I have reviewed the triage vital signs and the nursing notes.  Pertinent labs & imaging results that were available during my care of the patient were reviewed by me and considered in my medical decision making (see chart for  details).  Clinical Course as of Jan 30 1237  Wed Jan 30, 2018  1237 Patient reassessed. She has been feeling comfortable with her pain since the Toradol.  Her nausea and vomiting is also resolved. Ultrasound reveals that patient has slight left-sided hydronephrosis, consistent with the clinical concern for ureteral colic.  All the other labs are reassuring and since patient is feeling comfortable we will discharge her with strict ER return precautions.   [AN]    Clinical Course User Index [AN] Derwood Kaplan, MD    27 year old female comes in with chief complaint of sudden onset left-sided abdominal pain.  Patient states that her pain started actually in the flank region has moved to the abdomen.  The pain is constant and severe.  There is associated nausea with vomiting.  Differential diagnosis for the sudden  onset severe pain includes ectopic pregnancy, renal stones, PID-TOA, ovarian torsion.  Plan is to proceed with ultrasound transvaginal and ultrasound renal first. Pain control initiated.  Final Clinical Impressions(s) / ED Diagnoses   Final diagnoses:  Pelvic pain  Ureteral colic    ED Discharge Orders         Ordered    ondansetron (ZOFRAN ODT) 8 MG disintegrating tablet  Every 8 hours PRN     01/30/18 1237    ibuprofen (ADVIL,MOTRIN) 400 MG tablet  Every 6 hours PRN     01/30/18 1237    HYDROcodone-acetaminophen (NORCO/VICODIN) 5-325 MG tablet  Every 8 hours PRN     01/30/18 1237    acetaminophen (TYLENOL) 325 MG tablet  Every 6 hours PRN     01/30/18 1237           Derwood Kaplan, MD 01/30/18 1610    Derwood Kaplan, MD 01/30/18 1238

## 2018-01-30 NOTE — ED Triage Notes (Signed)
Pt reports lower abdominal pain and low back pain since last night. N/v/d. She is crying hysterically in triage. She missed her period 3 weeks ago, can't think when her LMP was.

## 2018-05-18 IMAGING — CT CT NECK W/ CM
4 of 5 series · 14 of 33 positions shown, 16 images · IV contrast (Omni 300)
Comparison: None.

CLINICAL DATA: 26-year-old female with sore throat for 3 weeks.
Tonsillitis.

EXAM:
CT NECK WITH CONTRAST
TECHNIQUE: Multidetector CT imaging of the neck was performed using the
standard protocol following the bolus administration of intravenous
contrast.
CONTRAST:  100mL OMNIPAQUE IOHEXOL 300 MG/ML  SOLN

[Series 3: neck 2.0 st · axial · 0.43mm/px · z∈[-307,-157]mm · 4 of 127 slices shown, 5 images (1 of 3)]
[im 26/127  soft-tissue]
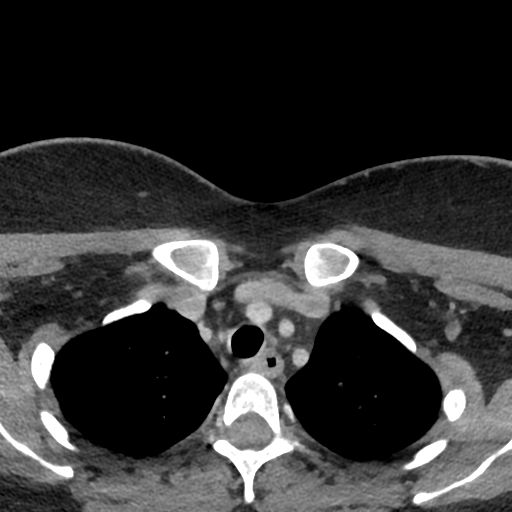
[im 26/127  bone]
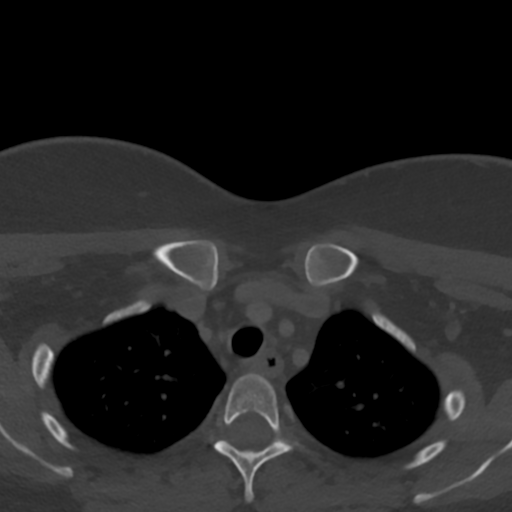
[im 51/127  bone]
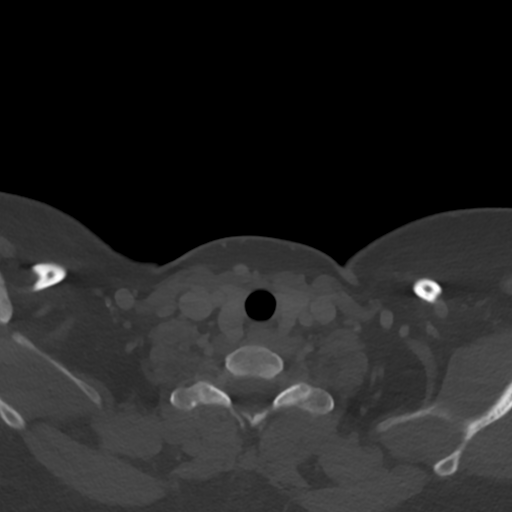
[im 76/127  bone]
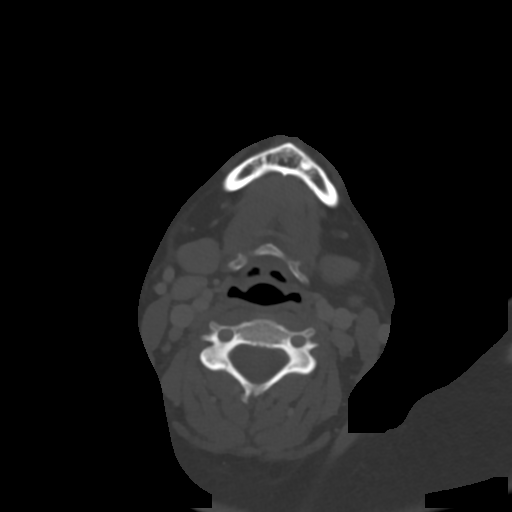
[im 101/127  bone]
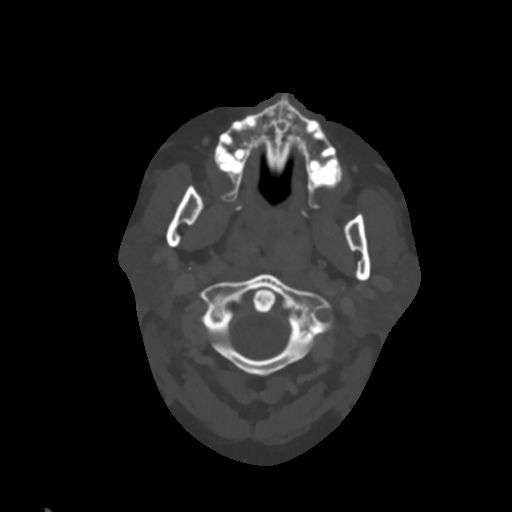

[Series 5: neck 2.0 st · sagittal · 0.46mm/px · 5 of 101 slices shown, 6 images (2 of 3)]
[im 34/101  bone]
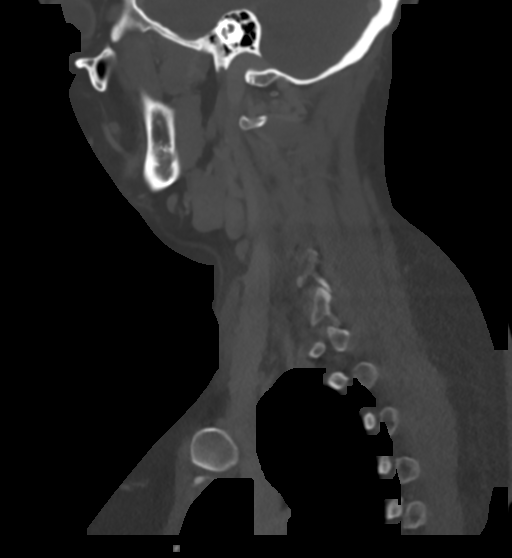
[im 42/101  bone]
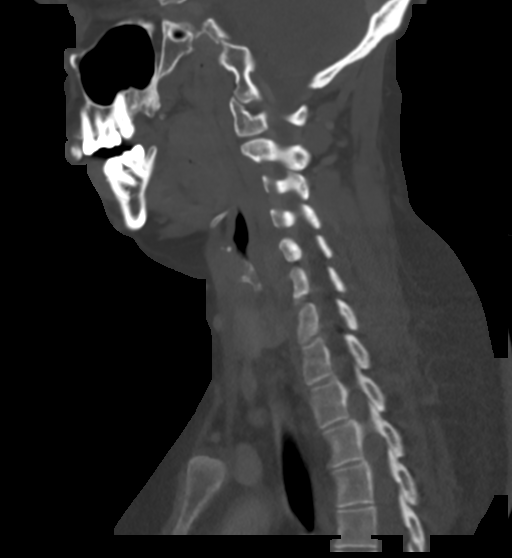
[im 51/101  soft-tissue]
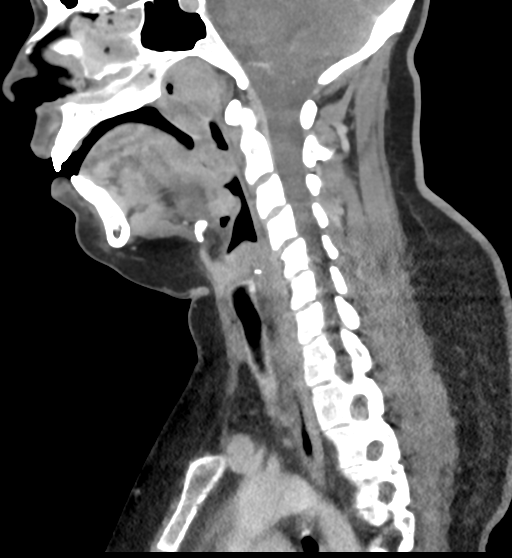
[im 51/101  bone]
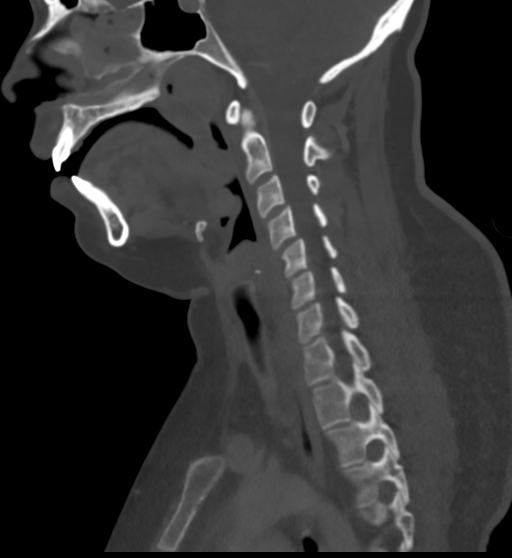
[im 59/101  bone]
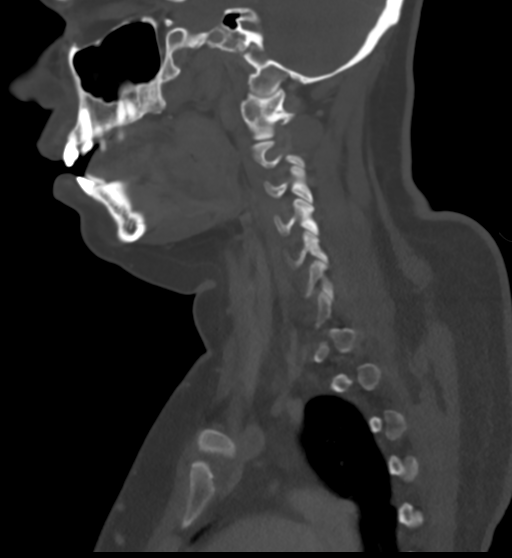
[im 67/101  bone]
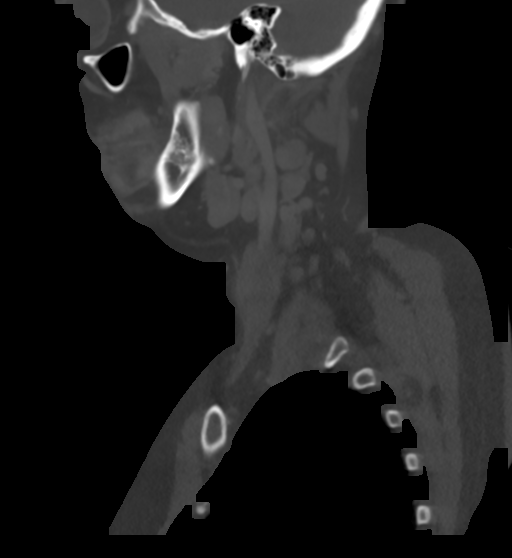

[Series 6: neck 2.0 st · coronal · 0.31mm/px · 3 of 111 slices shown (3 of 3)]
[im 23/111  bone]
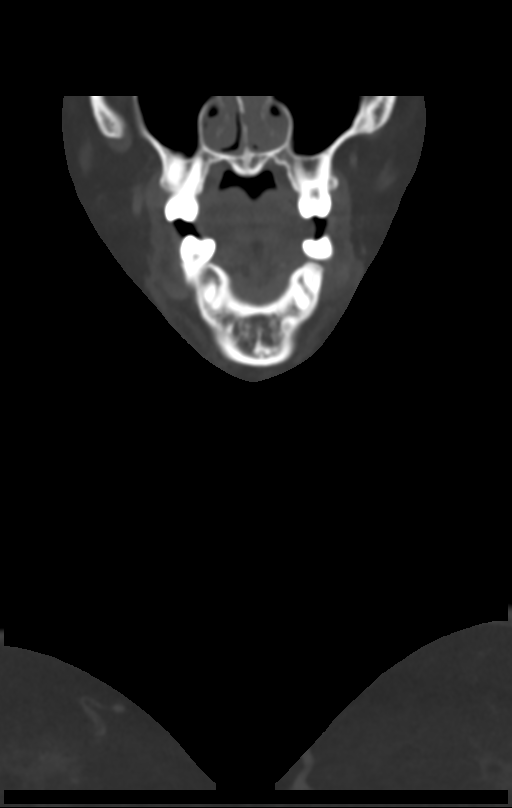
[im 45/111  bone]
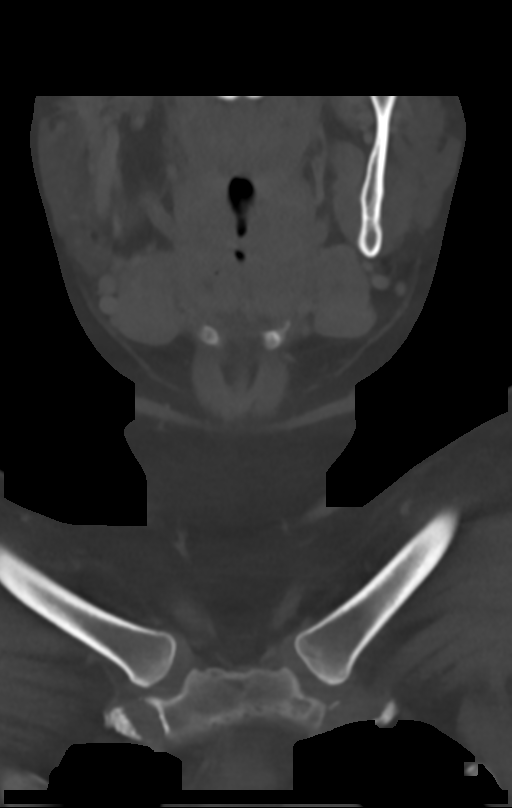
[im 67/111  bone]
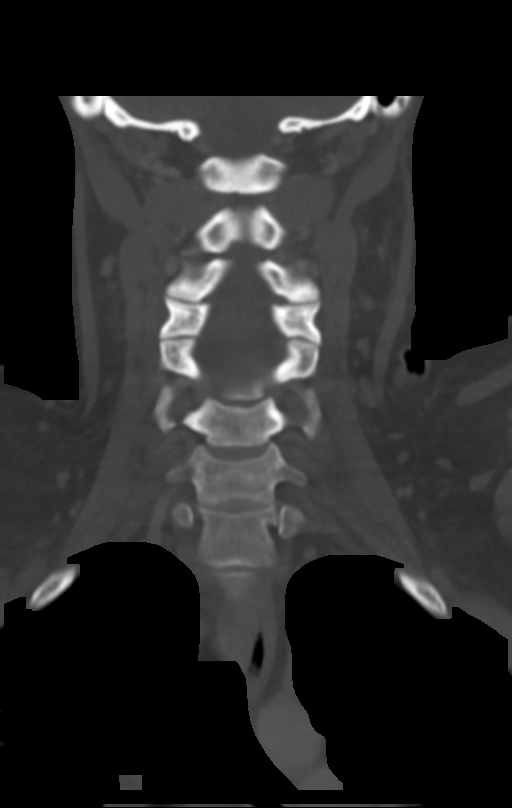

[Series 7: neck 2.0 st orthogonal · axial · 0.39mm/px · z∈[-310,-260]mm · 2 of 125 slices shown]
[im 25/125  bone]
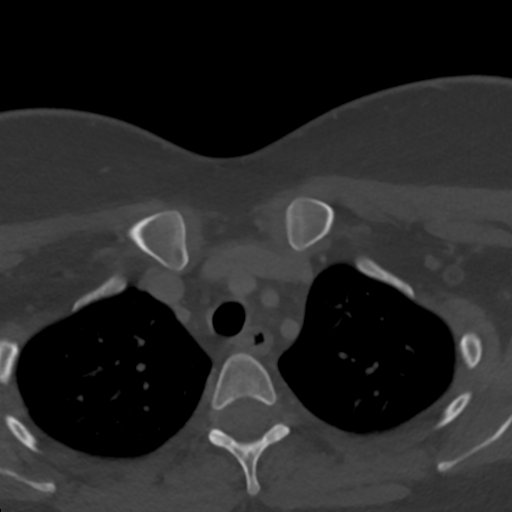
[im 50/125  bone]
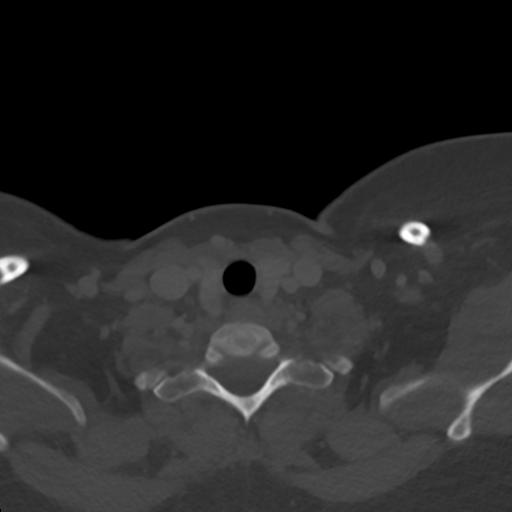

[14 of 33 positions shown; findings below may reference images not displayed]

FINDINGS: Pharynx and larynx: Normal larynx.  The epiglottis is normal.

There is moderate generalized enlargement of all pharyngeal
tonsillar tissue including the lingual tonsils, bilateral palatine
tonsils, and adenoids. The enlarged tonsillar tissue is symmetric
and homogeneously enhancing. No tonsillar fluid collection
identified. The parapharyngeal and retropharyngeal spaces remain
normal.

Salivary glands: Negative sublingual space. The bilateral
submandibular glands and parotid glands appear normal.

Thyroid: Negative.

Lymph nodes: Mildly enlarged reactive appearing bilateral level
2/level 3 lymph nodes individually measuring up to 12 millimeters
short axis. No cystic or necrotic nodes. There is associated mild
bilateral retropharyngeal lymphadenopathy (series 3, image 27 on the
left). The remaining nodal stations are spared.

Vascular: The major vascular structures in the neck and at the skull
base are patent including both internal jugular veins.

Limited intracranial: Negative.

Visualized orbits: Negative.

Mastoids and visualized paranasal sinuses: Mild to moderate
scattered bilateral ethmoid sinus mucosal thickening with some
bubbly opacity. Symmetric nasal cavity mucosal thickening with
retained secretions. The other visible paranasal sinuses are clear.
The tympanic cavities and mastoids are clear.

Skeleton: Negative.

Upper chest: Negative. Normal superior mediastinum. The visible
lungs are clear. The visible axillary lymph nodes appear normal.
IMPRESSION: 1. Symmetric generalized tonsillar enlargement throughout the
pharynx compatible with Acute Tonsillitis. No tonsillar abscess or
complicating features.
2. Reactive lymphadenopathy at the bilateral retropharyngeal, level
2, and level 3 nodal stations. No suppurated nodes.
3. Nasal and ethmoid mucosal thickening and secretions compatible
with associated Rhinosinusitis.

## 2018-05-28 ENCOUNTER — Observation Stay (HOSPITAL_COMMUNITY)
Admission: EM | Admit: 2018-05-28 | Discharge: 2018-05-29 | Disposition: A | Discharge status: Home / Self Care | Payer: Self-pay | Attending: Internal Medicine | Admitting: Internal Medicine

## 2018-05-28 ENCOUNTER — Encounter (HOSPITAL_COMMUNITY): Payer: Self-pay | Admitting: Emergency Medicine

## 2018-05-28 DIAGNOSIS — J038 Acute tonsillitis due to other specified organisms: Secondary | ICD-10-CM

## 2018-05-28 DIAGNOSIS — B2799 Infectious mononucleosis, unspecified with other complication: Secondary | ICD-10-CM

## 2018-05-28 DIAGNOSIS — R591 Generalized enlarged lymph nodes: Secondary | ICD-10-CM | POA: Insufficient documentation

## 2018-05-28 DIAGNOSIS — Z79899 Other long term (current) drug therapy: Secondary | ICD-10-CM | POA: Insufficient documentation

## 2018-05-28 DIAGNOSIS — J039 Acute tonsillitis, unspecified: Secondary | ICD-10-CM | POA: Insufficient documentation

## 2018-05-28 DIAGNOSIS — B2790 Infectious mononucleosis, unspecified without complication: Secondary | ICD-10-CM | POA: Diagnosis present

## 2018-05-28 DIAGNOSIS — M419 Scoliosis, unspecified: Secondary | ICD-10-CM | POA: Insufficient documentation

## 2018-05-28 DIAGNOSIS — K219 Gastro-esophageal reflux disease without esophagitis: Secondary | ICD-10-CM | POA: Insufficient documentation

## 2018-05-28 DIAGNOSIS — R000 Tachycardia, unspecified: Secondary | ICD-10-CM | POA: Insufficient documentation

## 2018-05-28 DIAGNOSIS — B2789 Other infectious mononucleosis with other complication: Principal | ICD-10-CM | POA: Insufficient documentation

## 2018-05-28 DIAGNOSIS — Z791 Long term (current) use of non-steroidal anti-inflammatories (NSAID): Secondary | ICD-10-CM | POA: Insufficient documentation

## 2018-05-28 DIAGNOSIS — F909 Attention-deficit hyperactivity disorder, unspecified type: Secondary | ICD-10-CM | POA: Insufficient documentation

## 2018-05-28 DIAGNOSIS — Z87891 Personal history of nicotine dependence: Secondary | ICD-10-CM | POA: Insufficient documentation

## 2018-05-28 LAB — COMPREHENSIVE METABOLIC PANEL
ALK PHOS: 72 U/L (ref 38–126)
ALT: 22 U/L (ref 0–44)
AST: 24 U/L (ref 15–41)
Albumin: 4.3 g/dL (ref 3.5–5.0)
Anion gap: 8 (ref 5–15)
BILIRUBIN TOTAL: 0.5 mg/dL (ref 0.3–1.2)
BUN: 6 mg/dL (ref 6–20)
CALCIUM: 9.7 mg/dL (ref 8.9–10.3)
CO2: 23 mmol/L (ref 22–32)
Chloride: 106 mmol/L (ref 98–111)
Creatinine, Ser: 0.73 mg/dL (ref 0.44–1.00)
GFR calc Af Amer: >60 mL/min (ref >60)
GFR calc non Af Amer: >60 mL/min (ref >60)
Glucose, Bld: 105 mg/dL — ABNORMAL HIGH (ref 70–99)
Potassium: 4.1 mmol/L (ref 3.5–5.1)
Sodium: 137 mmol/L (ref 135–145)
TOTAL PROTEIN: 8.3 g/dL — AB (ref 6.5–8.1)

## 2018-05-28 LAB — CBC WITH DIFFERENTIAL/PLATELET
Abs Immature Granulocytes: 0.07 10*3/uL (ref 0.00–0.07)
BASOS PCT: 0 %
Basophils Absolute: 0.1 10*3/uL (ref 0.0–0.1)
EOS ABS: 0.2 10*3/uL (ref 0.0–0.5)
EOS PCT: 1 %
HCT: 45.7 % (ref 36.0–46.0)
Hemoglobin: 14.7 g/dL (ref 12.0–15.0)
Immature Granulocytes: 0 %
Lymphocytes Relative: 8 %
Lymphs Abs: 1.3 10*3/uL (ref 0.7–4.0)
MCH: 29.3 pg (ref 26.0–34.0)
MCHC: 32.2 g/dL (ref 30.0–36.0)
MCV: 91.2 fL (ref 80.0–100.0)
MONO ABS: 1 10*3/uL (ref 0.1–1.0)
MONOS PCT: 6 %
NEUTROS PCT: 85 %
Neutro Abs: 14.2 10*3/uL — ABNORMAL HIGH (ref 1.7–7.7)
PLATELETS: 313 10*3/uL (ref 150–400)
RBC: 5.01 MIL/uL (ref 3.87–5.11)
RDW: 12.4 % (ref 11.5–15.5)
WBC: 16.8 10*3/uL — ABNORMAL HIGH (ref 4.0–10.5)
nRBC: 0 % (ref 0.0–0.2)

## 2018-05-28 LAB — URINALYSIS, ROUTINE W REFLEX MICROSCOPIC
Bilirubin Urine: NEGATIVE
GLUCOSE, UA: NEGATIVE mg/dL
Hgb urine dipstick: NEGATIVE
KETONES UR: NEGATIVE mg/dL
Leukocytes, UA: NEGATIVE
Nitrite: NEGATIVE
PROTEIN: NEGATIVE mg/dL
Specific Gravity, Urine: 1.025 (ref 1.005–1.030)
pH: 8 (ref 5.0–8.0)

## 2018-05-28 LAB — I-STAT BETA HCG BLOOD, ED (MC, WL, AP ONLY): I-stat hCG, quantitative: <5 m[IU]/mL (ref <5)

## 2018-05-28 NOTE — ED Triage Notes (Signed)
Pt reports flu like symptoms X2 weeks, believed she was feeling better but then two days ago she "got a whole lot worse"  Other symptoms include SOB, chills/sweats, general body aches, congestion.

## 2018-05-29 ENCOUNTER — Emergency Department (HOSPITAL_COMMUNITY): Payer: Self-pay

## 2018-05-29 DIAGNOSIS — B2790 Infectious mononucleosis, unspecified without complication: Secondary | ICD-10-CM

## 2018-05-29 DIAGNOSIS — B2799 Infectious mononucleosis, unspecified with other complication: Secondary | ICD-10-CM

## 2018-05-29 DIAGNOSIS — J038 Acute tonsillitis due to other specified organisms: Secondary | ICD-10-CM

## 2018-05-29 HISTORY — DX: Infectious mononucleosis, unspecified without complication: B27.90

## 2018-05-29 LAB — HIV ANTIBODY (ROUTINE TESTING W REFLEX): HIV Screen 4th Generation wRfx: NONREACTIVE

## 2018-05-29 LAB — GROUP A STREP BY PCR: GROUP A STREP BY PCR: NOT DETECTED

## 2018-05-29 LAB — MONONUCLEOSIS SCREEN: Mono Screen: POSITIVE — AB

## 2018-05-29 MED ORDER — ACETAMINOPHEN 325 MG PO TABS: 650.0000 mg | ORAL_TABLET | Freq: Four times a day (QID) | ORAL | Status: DC | PRN

## 2018-05-29 MED ORDER — NICOTINE 21 MG/24HR TD PT24: 21.0000 mg | MEDICATED_PATCH | Freq: Every day | TRANSDERMAL | Status: DC

## 2018-05-29 MED ORDER — IOHEXOL 300 MG/ML  SOLN
75.0000 mL | Freq: Once | INTRAMUSCULAR | Status: AC | PRN
  Administered 2018-05-29: 75 mL via INTRAVENOUS

## 2018-05-29 MED ORDER — ACETAMINOPHEN 500 MG PO TABS
1000.0000 mg | ORAL_TABLET | Freq: Four times a day (QID) | ORAL | Status: DC | PRN
  Administered 2018-05-29: 1000 mg via ORAL
  Filled 2018-05-29: qty 2

## 2018-05-29 MED ORDER — SODIUM CHLORIDE 0.9 % IV SOLN: INTRAVENOUS | Status: DC

## 2018-05-29 MED ORDER — ENOXAPARIN SODIUM 40 MG/0.4ML ~~LOC~~ SOLN
40.0000 mg | Freq: Every day | SUBCUTANEOUS | Status: DC
  Filled 2018-05-29: qty 0.4

## 2018-05-29 MED ORDER — DEXAMETHASONE SODIUM PHOSPHATE 10 MG/ML IJ SOLN
10.0000 mg | Freq: Three times a day (TID) | INTRAMUSCULAR | Status: AC
  Administered 2018-05-29 (×2): 10 mg via INTRAVENOUS
  Filled 2018-05-29 (×2): qty 1

## 2018-05-29 MED ORDER — CLINDAMYCIN PHOSPHATE 600 MG/50ML IV SOLN
600.0000 mg | Freq: Once | INTRAVENOUS | Status: AC
  Administered 2018-05-29: 600 mg via INTRAVENOUS
  Filled 2018-05-29: qty 50

## 2018-05-29 MED ORDER — ACETAMINOPHEN 500 MG PO TABS
1000.0000 mg | ORAL_TABLET | Freq: Once | ORAL | Status: AC
  Administered 2018-05-29: 1000 mg via ORAL
  Filled 2018-05-29: qty 2

## 2018-05-29 MED ORDER — ONDANSETRON HCL 4 MG/2ML IJ SOLN: 4.0000 mg | Freq: Four times a day (QID) | INTRAMUSCULAR | Status: DC | PRN

## 2018-05-29 MED ORDER — ACETAMINOPHEN 500 MG PO TABS: 1000.0000 mg | ORAL_TABLET | Freq: Four times a day (QID) | ORAL | 0 refills | Status: DC | PRN

## 2018-05-29 MED ORDER — DEXAMETHASONE SODIUM PHOSPHATE 10 MG/ML IJ SOLN
10.0000 mg | Freq: Once | INTRAMUSCULAR | Status: AC
  Administered 2018-05-29: 10 mg via INTRAVENOUS
  Filled 2018-05-29: qty 1

## 2018-05-29 MED ORDER — SODIUM CHLORIDE 0.9 % IV BOLUS
1000.0000 mL | Freq: Once | INTRAVENOUS | Status: AC
  Administered 2018-05-29: 1000 mL via INTRAVENOUS

## 2018-05-29 MED ORDER — ACETAMINOPHEN 650 MG RE SUPP: 650.0000 mg | Freq: Four times a day (QID) | RECTAL | Status: DC | PRN

## 2018-05-29 MED ORDER — ONDANSETRON HCL 4 MG PO TABS: 4.0000 mg | ORAL_TABLET | Freq: Four times a day (QID) | ORAL | Status: DC | PRN

## 2018-05-29 NOTE — H&P (Signed)
History and Physical    Brandi Hoffman ZOX:[id number] DOB: November 10, 1990 DOA: 05/28/2018  PCP: Patient, No Pcp Per  Patient coming from: Home  I have personally briefly reviewed patient's old medical [facility]  Chief Complaint: Fever, ILI  HPI: Brandi BertholdMackenzie Frandsen is a 28 y.o. female with medical history significant of ADHD, GERD, recurrent tonsillitis.  Patient presents to the ED with c/o 2 week history of waxing and waning fever, sore throat, chills, headache.  Nothing makes symptoms better or worse.  Severe associated fatigue especially over past couple of days. Also reports that her voice is started to sound more muffled over the last few days.  Reports that she has had increased pain with swallowing, but has been able to eat and drink at home.  Tried tylenol at home for symptoms.   ED Course: Work up in ED is positive for mono, WBC 16k.  CT neck shows B swollen tonsils, some effacement of airway, no peritonsillar abscess, bilateral cervical lymphadenopathy.   Review of Systems: As per HPI otherwise 10 point review of systems negative.   Past Medical History:  Diagnosis Date  . ADHD (attention deficit hyperactivity disorder)   . GERD (gastroesophageal reflux disease)   . Pregnant   . Scoliosis     Past Surgical History:  Procedure Laterality Date  . LAPAROSCOPIC APPENDECTOMY N/A 05/06/2015   Procedure: APPENDECTOMY LAPAROSCOPIC;  Surgeon: Glenna FellowsBenjamin Hoxworth, MD;  Location: [facility];  Service: General;  Laterality: N/A;  . MULTIPLE TOOTH EXTRACTIONS       reports that she has quit smoking. She has never used smokeless tobacco. She reports that she does not drink alcohol or use drugs.  No Known Allergies  Family History  Adopted: Yes     Prior to Admission medications   Medication Sig Start Date End Date Taking? Authorizing Provider  acetaminophen (TYLENOL) 325 MG tablet Take 2 tablets (650 mg total) by mouth every 6 (six) hours as needed. Patient not  taking: Reported on 05/29/2018 01/30/18   Derwood KaplanNanavati, Ankit, MD  amoxicillin-clavulanate (AUGMENTIN) 875-125 MG tablet Take 1 tablet by mouth every 12 (twelve) hours. Patient not taking: Reported on 05/29/2018 09/16/17   Bethel BornGekas, Kelly Marie, PA-C  ibuprofen (ADVIL,MOTRIN) 400 MG tablet Take 1 tablet (400 mg total) by mouth every 6 (six) hours as needed. Patient not taking: Reported on 05/29/2018 01/30/18   Derwood KaplanNanavati, Ankit, MD  ondansetron (ZOFRAN ODT) 8 MG disintegrating tablet Take 1 tablet (8 mg total) by mouth every 8 (eight) hours as needed for nausea. Patient not taking: Reported on 05/29/2018 01/30/18   Derwood KaplanNanavati, Ankit, MD  predniSONE (DELTASONE) 20 MG tablet Take 2 tablets (40 mg total) by mouth daily. Patient not taking: Reported on 05/29/2018 09/16/17   Bethel BornGekas, Kelly Marie, PA-C    Physical Exam: Vitals:   05/28/18 2207 05/28/18 2303 05/29/18 0042  BP: (!) 110/93 (!) 103/58   Pulse: (!) 140 (!) 115 (!) 130  Resp: 16 16   Temp: 99.8 F (37.7 C) 100.1 F (37.8 C) (!) 103.2 F (39.6 C)  TempSrc: Oral Oral Oral  SpO2: 100% 100%     Constitutional: NAD, calm, comfortable Eyes: PERRL, lids and conjunctivae normal ENMT: Pharyngitis with inflamed and edematous tonsils Neck: normal, supple, no masses, no thyromegaly Respiratory: clear to auscultation bilaterally, no wheezing, no crackles. Normal respiratory effort. No accessory muscle use.  Cardiovascular: Regular rate and rhythm, no murmurs / rubs / gallops. No extremity edema. 2+ pedal pulses. No carotid bruits.  Abdomen: no  tenderness, no masses palpated. No hepatosplenomegaly. Bowel sounds positive.  Musculoskeletal: no clubbing / cyanosis. No joint deformity upper and lower extremities. Good ROM, no contractures. Normal muscle tone.  Skin: no rashes, lesions, ulcers. No induration Neurologic: CN 2-12 grossly intact. Sensation intact, DTR normal. Strength 5/5 in all 4.  Psychiatric: Normal judgment and insight. Alert and oriented x 3. Normal  mood.    Labs on Admission: I have personally reviewed following labs and imaging studies  CBC: Recent Labs  Lab 05/28/18 2214  WBC 16.8*  NEUTROABS 14.2*  HGB 14.7  HCT 45.7  MCV 91.2  PLT 313   Basic Metabolic Panel: Recent Labs  Lab 05/28/18 2214  NA 137  K 4.1  CL 106  CO2 23  GLUCOSE 105*  BUN 6  CREATININE 0.73  CALCIUM 9.7   GFR: CrCl cannot be calculated (Unknown ideal weight.). Liver Function Tests: Recent Labs  Lab 05/28/18 2214  AST 24  ALT 22  ALKPHOS 72  BILITOT 0.5  PROT 8.3*  ALBUMIN 4.3   No results for input(s): LIPASE, AMYLASE in the last 168 hours. No results for input(s): AMMONIA in the last 168 hours. Coagulation Profile: No results for input(s): INR, PROTIME in the last 168 hours. Cardiac Enzymes: No results for input(s): CKTOTAL, CKMB, CKMBINDEX, TROPONINI in the last 168 hours. BNP (last 3 results) No results for input(s): PROBNP in the last 8760 hours. HbA1C: No results for input(s): HGBA1C in the last 72 hours. CBG: No results for input(s): GLUCAP in the last 168 hours. Lipid Profile: No results for input(s): CHOL, HDL, LDLCALC, TRIG, CHOLHDL, LDLDIRECT in the last 72 hours. Thyroid Function Tests: No results for input(s): TSH, T4TOTAL, FREET4, T3FREE, THYROIDAB in the last 72 hours. Anemia Panel: No results for input(s): VITAMINB12, FOLATE, FERRITIN, TIBC, IRON, RETICCTPCT in the last 72 hours. Urine analysis:    Component Value Date/Time   COLORURINE YELLOW 05/28/2018 2214   APPEARANCEUR CLEAR 05/28/2018 2214   LABSPEC 1.025 05/28/2018 2214   PHURINE 8.0 05/28/2018 2214   GLUCOSEU NEGATIVE 05/28/2018 2214   HGBUR NEGATIVE 05/28/2018 2214   BILIRUBINUR NEGATIVE 05/28/2018 2214   KETONESUR NEGATIVE 05/28/2018 2214   PROTEINUR NEGATIVE 05/28/2018 2214   UROBILINOGEN 1.0 05/26/2014 1740   NITRITE NEGATIVE 05/28/2018 2214   LEUKOCYTESUR NEGATIVE 05/28/2018 2214    Radiological Exams on Admission: Dg Chest 2  View  Result Date: 05/29/2018 CLINICAL DATA:  Shortness of breath EXAM: CHEST - 2 VIEW COMPARISON:  09/05/2017 FINDINGS: Mild bronchitic changes. No focal airspace disease or effusion. Normal heart size. No pneumothorax. IMPRESSION: No active cardiopulmonary disease.  Mild bronchitic changes. Electronically Signed   By: Jasmine Pang M.D.   On: 05/29/2018 01:43   Ct Soft Tissue Neck W Contrast  Result Date: 05/29/2018 CLINICAL DATA:  Shortness of breath, chills, body aches and congestion for 2 weeks, worsening for 2 days. History of tonsillar/adenoidal disorder. EXAM: CT NECK WITH CONTRAST TECHNIQUE: Multidetector CT imaging of the neck was performed using the standard protocol following the bolus administration of intravenous contrast. CONTRAST:  56mL OMNIPAQUE IOHEXOL 300 MG/ML  SOLN COMPARISON:  CT neck Sep 16, 2017 FINDINGS: PHARYNX AND LARYNX: Symmetrically enlarged bilateral palatine tonsils with striated enhancement, to lesser extent within adenoidal soft tissues. Focally narrowed oropharynx. No focal fluid collections. Preservation of the parapharyngeal fat planes. SALIVARY GLANDS: Normal. THYROID: Normal. LYMPH NODES: 13 mm LEFT level IIa and 12 mm RIGHT level II a lymphadenopathy with homogeneous enhancement, no pericapsular fat stranding. VASCULAR: Normal.  LIMITED INTRACRANIAL: Normal. VISUALIZED ORBITS: Normal. MASTOIDS AND VISUALIZED PARANASAL SINUSES: Well-aerated. SKELETON: Nonacute. UPPER CHEST: Lung apices are clear. No superior mediastinal lymphadenopathy. OTHER: None. IMPRESSION: 1. Acute tonsillitis narrowing the oropharynx.  No abscess. 2. Mild reactive lymphadenopathy. Electronically Signed   By: Awilda Metro M.D.   On: 05/29/2018 02:05    EKG: Independently reviewed.  Assessment/Plan Principal Problem:   Acute tonsillitis due to infectious mononucleosis    1. Infectious mono with tonsillitis - 1. EDP concerned for airway obstruction 2. No abscess on CT 3. EDP spoke with  ENT Dr. Doran Heater who recommended 1. Decadron 10mg  IV q8h X3 doses 2. Usually resolves with this, sometimes after 2nd does 3. if clinically the patient has significantly improved before the dinner dose that she can be discharged home with outpatient follow-up to ENT. 4. Does not typically require surgical intervention 4. Will also order EBV and CMV pnl 5. IVF: NS at 100 6. Tele monitor for tachycardia 7. Tylenol for fever  DVT prophylaxis: Lovenox Code Status: Full Family Communication: No family in room Disposition Plan: Home after admit Consults called: EDP spoke with Dr. Doran Heater Admission status: Place in [place], [place] M. DO [facility] Pager [phone] Only works nights!  If 7AM-7PM, please contact the primary day team physician taking care of patient  [email or web address] Password [facility]  05/29/2018, 5:24 AM

## 2018-05-29 NOTE — Discharge Instructions (Signed)
Infectious Mononucleosis  Infectious mononucleosis is a viral infection. It is often referred to as “mono.” It causes symptoms that affect various areas of the body, including the throat, upper air passages, and lymph glands. The liver or spleen may also be affected.  The virus spreads from person to person (is contagious) through close contact. The illness is usually not serious, and it typically goes away in 2-4 weeks without treatment. In rare cases, symptoms can be more severe and last longer, sometimes up to several months.  What are the causes?  This condition is commonly caused by the Epstein-Barr virus. This virus spreads through:  · Contact with an infected person’s saliva or other bodily fluids, often through:  ? Kissing.  ? Sexual contact.  ? Coughing.  ? Sneezing.  · Sharing utensils or drinking glasses that were recently used by an infected person.  · Blood transfusions.  · Organ transplantation.  What increases the risk?  You are more likely to develop this condition if:  · You are 15-24 years old.  What are the signs or symptoms?  Symptoms of this condition usually appear 4-6 weeks after infection. Symptoms may develop slowly and occur at different times. Common symptoms include:  · Sore throat.  · Headache.  · Extreme fatigue.  · Muscle aches.  · Swollen glands.  · Fever.  · Poor appetite.  · Rash.  Other symptoms include:  · Enlarged liver or spleen.  · Nausea.  · Abdominal pain.  How is this diagnosed?  This condition may be diagnosed based on:  · Your medical history.  · Your symptoms.  · A physical exam.  · Blood tests to confirm the diagnosis.  How is this treated?  There is no cure for this condition. Infectious mononucleosis usually goes away on its own with time. Treatment can help relieve symptoms and may include:  · Taking medicines to relieve pain and fever.  · Drinking plenty of fluids.  · Getting a lot of rest.  · Medicine (corticosteroids)to reduce swelling. This may be used if swelling  in the throat causes breathing or swallowing problems.  In some severe cases, treatment has to be given in a hospital.  Follow these instructions at home:  Medicines  · Take over-the-counter and prescription medicines only as told by your health care provider.  · Do not take ampicillin or amoxicillin. This may cause a rash.  · If you are under 18, do not take aspirin because of the association with Reye syndrome.  Activity  · Rest as needed.  · Do not participate in any of the following activities until your health care provider approves:  ? Contact sports. You may need to wait at least a month before participating in sports.  ? Exercise that requires a lot of energy.  ? Heavy lifting.  · Gradually resume your normal activities after your fever is gone, or when your health care provider tells you that you can. Be sure to rest when you get tired.  General instructions    · Avoid kissing or sharing utensils or drinking glasses until your health care provider tells you that you are no longer contagious.  · Drink enough fluid to keep your urine clear or pale yellow.  · Do not drink alcohol.  · If you have a sore throat:  ? Gargle with a salt-water mixture 3-4 times a day or as needed. To make a salt-water mixture, completely dissolve ½-1 tsp of salt in 1 cup of   warm water.  ? Eat soft foods. Cold foods such as ice cream or frozen ice pops can soothe a sore throat.  ? Try sucking on hard candy.  · Wash your hands often with soap and water to avoid spreading the infection. If soap and water are not available, use hand sanitizer.  How is this prevented?    · Avoid contact with people who are infected with mononucleosis. An infected person may not always appear ill, but he or she can still spread the virus.  · Avoid sharing utensils, drinking glasses, or toothbrushes.  · Wash your hands frequently with soap and water. If soap and water are not available, use hand sanitizer.  · Use the inside of your elbow to cover your  mouth when coughing or sneezing.  Contact a health care provider if:  · Your fever is not gone after 10 days.  · You have swollen lymph nodes that are not back to normal after 4 weeks.  · Your activity level is not back to normal after 2 months.  · Your skin or the white parts of your eyes turn yellow (jaundice).  · You have constipation. This may mean that you have:  ? Fewer bowel movements in a week than normal.  ? Difficulty having a bowel movement.  ? Stools that are dry, hard, or larger than normal.  Get help right away if:  · You have severe pain in your abdomen or shoulder.  · You are drooling.  · You have trouble swallowing.  · You have trouble breathing.  · You develop a stiff neck.  · You develop a severe headache.  · You cannot stop vomiting.  · You have jerky movements that you cannot control (seizures).  · You are confused.  · You have trouble with balance.  · Your nose or gums begin to bleed.  · You have signs of dehydration. These may include:  ? Weakness.  ? Sunken eyes.  ? Pale skin.  ? Dry mouth.  ? Rapid breathing or pulse.  Summary  · Infectious mononucleosis, or "mono," is an infection caused by the Epstein-Barr virus.  · The virus that causes this condition is spread through bodily fluids. The virus is most commonly spread by kissing or sharing drinks or utensils with an infected person.  · You are more likely to develop this infection if you are 15-24 years old.  · Symptoms of this condition can include sore throat, headache, fever, swollen glands, muscle aches, extreme fatigue, and swollen liver or spleen.  · There is no cure for this condition. The goal of treatment is to help relieve symptoms. Treatment may include drinking plenty of water, getting a lot of rest, and taking pain relievers.  This information is not intended to replace advice given to you by your health care provider. Make sure you discuss any questions you have with your health care provider.  Document Released: 04/21/2000  Document Revised: 01/11/2016 Document Reviewed: 01/11/2016  Elsevier Interactive Patient Education © 2019 [employer]
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
## 2018-05-29 NOTE — ED Notes (Signed)
Pt to x-ray and CT at this time.

## 2018-05-29 NOTE — ED Notes (Signed)
Pt received breakfast tray 

## 2018-05-29 NOTE — Progress Notes (Signed)
Patient admitted after midnight, improved with tylenol, decadron and IV fluis.  Able to eat breakfast-- some pain with swallowing still.  Will re-eval this PM with possible d/c later today. Marlin Canary DO

## 2018-05-29 NOTE — ED Notes (Signed)
Pt to

## 2018-05-29 NOTE — ED Notes (Signed)
Ordered a breakfast tray--Brandi Hoffman 

## 2018-05-29 NOTE — Discharge Summary (Signed)
Physician Discharge Summary  Brandi BertholdMackenzie Hoffman WUJ:[id number] DOB: [birth year] DOA: 05/28/2018  PCP: Patient, No Pcp Per  Admit date: 05/28/2018 Discharge date: 05/29/2018  Admitted From: home Discharge disposition: home   Recommendations for Outpatient Follow-Up:   1. Establish with PCP Or clinic-- she will need referral to ENT for follow up   Discharge Diagnosis:   Principal Problem:   Acute tonsillitis due to infectious mononucleosis    Discharge Condition: Improved.  Diet recommendation: soft  Wound care: None.  Code status: Full.   History of Present Illness:   Brandi Hoffman is a 28 y.o. female with medical history significant of ADHD, GERD, recurrent tonsillitis.  Patient presents to the ED with c/o 2 week history of waxing and waning fever, sore throat, chills, headache.  Nothing makes symptoms better or worse.  Severe associated fatigue especially over past couple of days.Also reports that her voice is started to sound more muffled over the last few days. Reports that she has had increased pain with swallowing, but has been able to eat and drink at home.   Hospital Course by Problem:   Infectious mono with tonsillitis - 1. No abscess on CT 2. EDP spoke with ENT Dr. Doran HeaterMarcellino who recommended 1. Decadron 10mg  IV q8h X3 doses 2. Much improved     Medical Consultants:   ENT (phone)   Discharge Exam:   Vitals:   05/29/18 0042 05/29/18 0821  BP:  (!) 124/55  Pulse: (!) 130 100  Resp:  20  Temp: (!) 103.2 F (39.6 C) 98 F (36.7 C)  SpO2:  98%   Vitals:   05/28/18 2207 05/28/18 2303 05/29/18 0042 05/29/18 0821  BP: (!) 110/93 (!) 103/58  (!) 124/55  Pulse: (!) 140 (!) 115 (!) 130 100  Resp: 16 16  20   Temp: 99.8 F (37.7 C) 100.1 F (37.8 C) (!) 103.2 F (39.6 C) 98 F (36.7 C)  TempSrc: Oral Oral Oral Oral  SpO2: 100% 100%  98%    General exam: Appears calm and comfortable. Feeling much better-- able to swallow  The  results of significant diagnostics from this hospitalization (including imaging, microbiology, ancillary and laboratory) are listed below for reference.     Procedures and Diagnostic Studies:   Dg Chest 2 View  Result Date: 05/29/2018 CLINICAL DATA:  Shortness of breath EXAM: CHEST - 2 VIEW COMPARISON:  09/05/2017 FINDINGS: Mild bronchitic changes. No focal airspace disease or effusion. Normal heart size. No pneumothorax. IMPRESSION: No active cardiopulmonary disease.  Mild bronchitic changes. Electronically Signed   By: Jasmine PangKim  Fujinaga M.D.   On: 05/29/2018 01:43   Ct Soft Tissue Neck W Contrast  Result Date: 05/29/2018 CLINICAL DATA:  Shortness of breath, chills, body aches and congestion for 2 weeks, worsening for 2 days. History of tonsillar/adenoidal disorder. EXAM: CT NECK WITH CONTRAST TECHNIQUE: Multidetector CT imaging of the neck was performed using the standard protocol following the bolus administration of intravenous contrast. CONTRAST:  75mL OMNIPAQUE IOHEXOL 300 MG/ML  SOLN COMPARISON:  CT neck Sep 16, 2017 FINDINGS: PHARYNX AND LARYNX: Symmetrically enlarged bilateral palatine tonsils with striated enhancement, to lesser extent within adenoidal soft tissues. Focally narrowed oropharynx. No focal fluid collections. Preservation of the parapharyngeal fat planes. SALIVARY GLANDS: Normal. THYROID: Normal. LYMPH NODES: 13 mm LEFT level IIa and 12 mm RIGHT level II a lymphadenopathy with homogeneous enhancement, no pericapsular fat stranding. VASCULAR: Normal. LIMITED INTRACRANIAL: Normal. VISUALIZED ORBITS: Normal. MASTOIDS AND VISUALIZED PARANASAL SINUSES: Well-aerated. SKELETON: Nonacute.  UPPER CHEST: Lung apices are clear. No superior mediastinal lymphadenopathy. OTHER: None. IMPRESSION: 1. Acute tonsillitis narrowing the oropharynx.  No abscess. 2. Mild reactive lymphadenopathy. Electronically Signed   By: Awilda Metro M.D.   On: 05/29/2018 02:05     Labs:   Basic Metabolic  Panel: Recent Labs  Lab 05/28/18 2214  NA 137  K 4.1  CL 106  CO2 23  GLUCOSE 105*  BUN 6  CREATININE 0.73  CALCIUM 9.7   GFR CrCl cannot be calculated (Unknown ideal weight.). Liver Function Tests: Recent Labs  Lab 05/28/18 2214  AST 24  ALT 22  ALKPHOS 72  BILITOT 0.5  PROT 8.3*  ALBUMIN 4.3   No results for input(s): LIPASE, AMYLASE in the last 168 hours. No results for input(s): AMMONIA in the last 168 hours. Coagulation profile No results for input(s): INR, PROTIME in the last 168 hours.  CBC: Recent Labs  Lab 05/28/18 2214  WBC 16.8*  NEUTROABS 14.2*  HGB 14.7  HCT 45.7  MCV 91.2  PLT 313   Cardiac Enzymes: No results for input(s): CKTOTAL, CKMB, CKMBINDEX, TROPONINI in the last 168 hours. BNP: Invalid input(s): POCBNP CBG: No results for input(s): GLUCAP in the last 168 hours. D-Dimer No results for input(s): DDIMER in the last 72 hours. Hgb A1c No results for input(s): HGBA1C in the last 72 hours. Lipid Profile No results for input(s): CHOL, HDL, LDLCALC, TRIG, CHOLHDL, LDLDIRECT in the last 72 hours. Thyroid function studies No results for input(s): TSH, T4TOTAL, T3FREE, THYROIDAB in the last 72 hours.  Invalid input(s): FREET3 Anemia work up No results for input(s): VITAMINB12, FOLATE, FERRITIN, TIBC, IRON, RETICCTPCT in the last 72 hours. Microbiology Recent Results (from the past 240 hour(s))  Group A Strep by PCR     Status: None   Collection Time: 05/29/18 12:56 AM  Result Value Ref Range Status   Group A Strep by PCR NOT DETECTED NOT DETECTED Final    Comment: Performed at [facility], [address] [place], [place] [address]     Discharge Instructions:   Discharge Instructions    Discharge instructions   Complete by:  As directed    Soft diet, please see attached information about fluids/rest   Increase activity slowly   Complete by:  As directed      Allergies as of 05/29/2018   No Known Allergies      Medication List    STOP taking these medications   amoxicillin-clavulanate 875-125 MG tablet Commonly known as:  AUGMENTIN   ondansetron 8 MG disintegrating tablet Commonly known as:  ZOFRAN ODT   predniSONE 20 MG tablet Commonly known as:  DELTASONE     TAKE these medications   acetaminophen 500 MG tablet Commonly known as:  TYLENOL Take 2 tablets (1,000 mg total) by mouth every 6 (six) hours as needed for mild pain or fever. What changed:    medication strength  how much to take  reasons to take this   ibuprofen 400 MG tablet Commonly known as:  ADVIL,MOTRIN Take 1 tablet (400 mg total) by mouth every 6 (six) hours as needed.         Time coordinating discharge: 25 min  Signed:  Joseph Art DO  [facility] 05/29/2018, 12:38 PM

## 2018-05-29 NOTE — ED Notes (Signed)
Pt tolerated eating chicken nuggets - states some throat soreness after eating.

## 2018-05-29 NOTE — ED Notes (Signed)
Lunch tray ordered for patient.

## 2018-05-31 LAB — CMV IGM: CMV IgM: 62 AU/mL — ABNORMAL HIGH (ref 0.0–29.9)

## 2018-05-31 LAB — EPSTEIN-BARR VIRUS VCA, IGG: EBV VCA IgG: >600 U/mL — ABNORMAL HIGH (ref 0.0–17.9)

## 2018-05-31 LAB — EPSTEIN-BARR VIRUS VCA, IGM: EBV VCA IgM: <36 U/mL (ref 0.0–35.9)

## 2018-06-02 LAB — RSV(RESPIRATORY SYNCYTIAL VIRUS) AB, BLOOD: RSV Ab: NEGATIVE

## 2018-12-04 ENCOUNTER — Emergency Department (HOSPITAL_COMMUNITY): Payer: Self-pay

## 2018-12-04 ENCOUNTER — Other Ambulatory Visit: Payer: Self-pay

## 2018-12-04 ENCOUNTER — Emergency Department (HOSPITAL_COMMUNITY)
Admission: EM | Admit: 2018-12-04 | Discharge: 2018-12-04 | Disposition: A | Discharge status: Home / Self Care | Payer: Self-pay | Attending: Emergency Medicine | Admitting: Emergency Medicine

## 2018-12-04 DIAGNOSIS — N760 Acute vaginitis: Secondary | ICD-10-CM

## 2018-12-04 DIAGNOSIS — B9689 Other specified bacterial agents as the cause of diseases classified elsewhere: Secondary | ICD-10-CM

## 2018-12-04 DIAGNOSIS — Z87891 Personal history of nicotine dependence: Secondary | ICD-10-CM | POA: Insufficient documentation

## 2018-12-04 DIAGNOSIS — R102 Pelvic and perineal pain: Secondary | ICD-10-CM

## 2018-12-04 DIAGNOSIS — M545 Low back pain: Secondary | ICD-10-CM | POA: Insufficient documentation

## 2018-12-04 DIAGNOSIS — R0789 Other chest pain: Secondary | ICD-10-CM | POA: Insufficient documentation

## 2018-12-04 DIAGNOSIS — R109 Unspecified abdominal pain: Secondary | ICD-10-CM | POA: Insufficient documentation

## 2018-12-04 DIAGNOSIS — R112 Nausea with vomiting, unspecified: Secondary | ICD-10-CM | POA: Insufficient documentation

## 2018-12-04 DIAGNOSIS — R300 Dysuria: Secondary | ICD-10-CM | POA: Insufficient documentation

## 2018-12-04 LAB — COMPREHENSIVE METABOLIC PANEL
ALT: 19 U/L (ref 0–44)
AST: 19 U/L (ref 15–41)
Albumin: 4.2 g/dL (ref 3.5–5.0)
Alkaline Phosphatase: 68 U/L (ref 38–126)
Anion gap: 11 (ref 5–15)
BUN: 7 mg/dL (ref 6–20)
CO2: 21 mmol/L — ABNORMAL LOW (ref 22–32)
Calcium: 9.4 mg/dL (ref 8.9–10.3)
Chloride: 108 mmol/L (ref 98–111)
Creatinine, Ser: 0.64 mg/dL (ref 0.44–1.00)
GFR calc Af Amer: >60 mL/min (ref >60)
GFR calc non Af Amer: >60 mL/min (ref >60)
Glucose, Bld: 96 mg/dL (ref 70–99)
Potassium: 4 mmol/L (ref 3.5–5.1)
Sodium: 140 mmol/L (ref 135–145)
Total Bilirubin: 0.6 mg/dL (ref 0.3–1.2)
Total Protein: 7.7 g/dL (ref 6.5–8.1)

## 2018-12-04 LAB — CBC
HCT: 44.6 % (ref 36.0–46.0)
Hemoglobin: 14.3 g/dL (ref 12.0–15.0)
MCH: 30.2 pg (ref 26.0–34.0)
MCHC: 32.1 g/dL (ref 30.0–36.0)
MCV: 94.1 fL (ref 80.0–100.0)
Platelets: 353 10*3/uL (ref 150–400)
RBC: 4.74 MIL/uL (ref 3.87–5.11)
RDW: 12.6 % (ref 11.5–15.5)
WBC: 12.4 10*3/uL — ABNORMAL HIGH (ref 4.0–10.5)
nRBC: 0 % (ref 0.0–0.2)

## 2018-12-04 LAB — URINALYSIS, ROUTINE W REFLEX MICROSCOPIC
Bacteria, UA: NONE SEEN
Bilirubin Urine: NEGATIVE
Glucose, UA: NEGATIVE mg/dL
Ketones, ur: NEGATIVE mg/dL
Leukocytes,Ua: NEGATIVE
Nitrite: NEGATIVE
Protein, ur: NEGATIVE mg/dL
Specific Gravity, Urine: 1.024 (ref 1.005–1.030)
pH: 6 (ref 5.0–8.0)

## 2018-12-04 LAB — I-STAT BETA HCG BLOOD, ED (MC, WL, AP ONLY): I-stat hCG, quantitative: <5 m[IU]/mL (ref <5)

## 2018-12-04 LAB — LIPASE, BLOOD: Lipase: 25 U/L (ref 11–51)

## 2018-12-04 LAB — WET PREP, GENITAL
Sperm: NONE SEEN
Trich, Wet Prep: NONE SEEN
Yeast Wet Prep HPF POC: NONE SEEN

## 2018-12-04 MED ORDER — KETOROLAC TROMETHAMINE 30 MG/ML IJ SOLN
30.0000 mg | Freq: Once | INTRAMUSCULAR | Status: AC
  Administered 2018-12-04: 30 mg via INTRAVENOUS
  Filled 2018-12-04: qty 1

## 2018-12-04 MED ORDER — METRONIDAZOLE 500 MG PO TABS: 500.0000 mg | ORAL_TABLET | Freq: Two times a day (BID) | ORAL | 0 refills | Status: AC

## 2018-12-04 MED ORDER — ONDANSETRON 4 MG PO TBDP
ORAL_TABLET | ORAL | Status: AC
  Administered 2018-12-04: 4 mg via ORAL
  Filled 2018-12-04: qty 1

## 2018-12-04 MED ORDER — DOXYCYCLINE HYCLATE 100 MG PO CAPS: 100.0000 mg | ORAL_CAPSULE | Freq: Two times a day (BID) | ORAL | 0 refills | Status: AC

## 2018-12-04 MED ORDER — SODIUM CHLORIDE 0.9% FLUSH
3.0000 mL | Freq: Once | INTRAVENOUS | Status: AC
  Administered 2018-12-04: 3 mL via INTRAVENOUS

## 2018-12-04 MED ORDER — SODIUM CHLORIDE 0.9 % IV BOLUS
1000.0000 mL | Freq: Once | INTRAVENOUS | Status: AC
  Administered 2018-12-04: 1000 mL via INTRAVENOUS

## 2018-12-04 MED ORDER — CEFTRIAXONE SODIUM 250 MG IJ SOLR
250.0000 mg | Freq: Once | INTRAMUSCULAR | Status: AC
  Administered 2018-12-04: 250 mg via INTRAMUSCULAR
  Filled 2018-12-04: qty 250

## 2018-12-04 MED ORDER — STERILE WATER FOR INJECTION IJ SOLN
INTRAMUSCULAR | Status: AC
  Filled 2018-12-04: qty 10

## 2018-12-04 MED ORDER — ONDANSETRON 4 MG PO TBDP
4.0000 mg | ORAL_TABLET | Freq: Once | ORAL | Status: AC
  Administered 2018-12-04: 4 mg via ORAL
  Filled 2018-12-04: qty 1

## 2018-12-04 MED ORDER — METOCLOPRAMIDE HCL 5 MG/ML IJ SOLN
5.0000 mg | Freq: Once | INTRAMUSCULAR | Status: AC
  Administered 2018-12-04: 5 mg via INTRAVENOUS
  Filled 2018-12-04: qty 2

## 2018-12-04 NOTE — ED Notes (Signed)
Patient verbalizes understanding of discharge instructions. Opportunity for questioning and answers were provided. Armband removed by staff, pt discharged from ED.  

## 2018-12-04 NOTE — ED Notes (Signed)
Patient transported to CT 

## 2018-12-04 NOTE — Discharge Instructions (Addendum)
Your CT scan did not show any evidence of a kidney stone on the left side today.  This could be that you just passed a stone today however given that you had such severe pain to your pelvic area and had pain with the ultrasound we are treating you for potential pelvic inflammatory disease.  You will be given antibiotics for this.  Do not drink alcohol with the antibiotics.  Please make an appointment to follow-up with an OB/GYN in the next 5 to 7 days.  You are given information to do so.  Please return to the emergency department for any new or worsening symptoms including fevers, persistent pain, persistent vomiting.

## 2018-12-04 NOTE — ED Provider Notes (Signed)
[facility] Provider Note   CSN: [id number] Arrival date & time: 12/04/18  1717    History   Chief Complaint Chief Complaint  Patient presents with   Back Pain    HPI Brandi Hoffman is a 28 y.o. female.     HPI   Pt is a 28 y/o female with a h/o ADHD, GERD, nephrolithiasis, who presents to the ED today for eval of left/mid back pain that started yesterday. Pain has been waxing and waning since onset. Pain is severe in nature. States that it feels like her prior kidney stones. She also c/o lower abdominal pain as well.   She reports associated nausea and vomiting. Denies diarrhea, fevers, constipation. She reports dysuria, but denies hematuria. Denies vaginal discharge. She states she is in a monogamous relationship with her boyfriend. They have unprotected intercourse and she is not concerned for STD.  She states she also has chest pain in the center of her chest that feels like a stabbing/tight pain. Denies SOB, pleuritic, or cough.   Denies leg pain/swelling, hemoptysis, recent surgery/trauma, recent long travel, hormone use, personal hx of cancer, or hx of DVT/PE.    Past Medical History:  Diagnosis Date   ADHD (attention deficit hyperactivity disorder)    GERD (gastroesophageal reflux disease)    Pregnant    Scoliosis     Patient Active Problem List   Diagnosis Date Noted   Acute tonsillitis due to infectious mononucleosis 05/29/2018   Acute gangrenous appendicitis 05/06/2015   NSVD (normal spontaneous vaginal delivery) 05/08/2013   Normal pregnancy 05/07/2013    Past Surgical History:  Procedure Laterality Date   LAPAROSCOPIC APPENDECTOMY N/A 05/06/2015   Procedure: APPENDECTOMY LAPAROSCOPIC;  Surgeon: Glenna FellowsBenjamin Hoxworth, MD;  Location: [facility];  Service: General;  Laterality: N/A;   MULTIPLE TOOTH EXTRACTIONS       OB History    Gravida  2   Para  2   Term  2   Preterm      AB      Living  2     SAB      TAB      Ectopic      Multiple      Live Births  2            Home Medications    Prior to Admission medications   Medication Sig Start Date End Date Taking? Authorizing Provider  doxycycline (VIBRAMYCIN) 100 MG capsule Take 1 capsule (100 mg total) by mouth 2 (two) times daily for 14 days. 12/04/18 12/18/18  Derrika Ruffalo S, PA-C  metroNIDAZOLE (FLAGYL) 500 MG tablet Take 1 tablet (500 mg total) by mouth 2 (two) times daily for 14 days. 12/04/18 12/18/18  Jagdeep Ancheta S, PA-C    Family History Family History  Adopted: Yes    Social History Social History   Tobacco Use   Smoking status: Former Smoker   Smokeless tobacco: Never Used  Substance Use Topics   Alcohol use: No   Drug use: No     Allergies   Patient has no known allergies.   Review of Systems Review of Systems  Constitutional: Negative for chills and fever.  HENT: Negative for ear pain and sore throat.   Eyes: Negative for visual disturbance.  Respiratory: Negative for cough and shortness of breath.   Cardiovascular: Negative for chest pain.  Gastrointestinal: Positive for abdominal pain, nausea and vomiting. Negative for blood in stool, constipation and diarrhea.  Genitourinary: Positive  for dysuria and flank pain. Negative for hematuria, urgency, vaginal bleeding and vaginal discharge.  Musculoskeletal: Positive for back pain.  Skin: Negative for color change and rash.  Neurological: Negative for headaches.  All other systems reviewed and are negative.   Physical Exam Updated Vital Signs BP (!) 107/58 (BP Location: Right Arm)    Pulse 79    Temp 97.9 F (36.6 C) (Oral)    Resp 16    Ht 5\' 5"  (1.651 m)    Wt 74.8 kg    LMP 11/20/2018    SpO2 100%    BMI 27.46 kg/m   Physical Exam Vitals signs and nursing note reviewed.  Constitutional:      General: She is not in acute distress.    Appearance: She is well-developed.  HENT:     Head: Normocephalic and atraumatic.  Eyes:       Conjunctiva/sclera: Conjunctivae normal.  Neck:     Musculoskeletal: Neck supple.  Cardiovascular:     Rate and Rhythm: Normal rate and regular rhythm.     Pulses: Normal pulses.     Heart sounds: Normal heart sounds. No murmur.  Pulmonary:     Effort: Pulmonary effort is normal. No respiratory distress.     Breath sounds: Normal breath sounds. No wheezing, rhonchi or rales.  Abdominal:     General: Bowel sounds are normal. There is no distension.     Palpations: Abdomen is soft.     Tenderness: There is abdominal tenderness (LUE, LLQ, suprapubic, RLQ). There is left CVA tenderness and guarding (voluntary). There is no right CVA tenderness or rebound.  Genitourinary:    Comments: Exam performed by Karrie Meresortni S Caedmon Louque,  exam chaperoned Date: 12/04/2018 Pelvic exam: normal external genitalia without evidence of trauma. VULVA: normal appearing vulva with no masses, tenderness or lesion. VAGINA: normal appearing vagina with normal color and discharge, no lesions. CERVIX: normal appearing cervix without lesions, mild CMP present, cervical with out purulent discharge; vaginal discharge, Wet prep and DNA probe for chlamydia and GC obtained.   ADNEXA: normal adnexa in size, left adnexal ttp present UTERUS: uterus is normal size, shape, consistency, TTP noted Skin:    General: Skin is warm and dry.  Neurological:     Mental Status: She is alert.    ED Treatments / Results  Labs (all labs ordered are listed, but only abnormal results are displayed) Labs Reviewed  WET PREP, GENITAL - Abnormal; Notable for the following components:      Result Value   Clue Cells Wet Prep HPF POC PRESENT (*)    WBC, Wet Prep HPF POC MODERATE (*)    All other components within normal limits  COMPREHENSIVE METABOLIC PANEL - Abnormal; Notable for the following components:   CO2 21 (*)    All other components within normal limits  CBC - Abnormal; Notable for the following components:   WBC 12.4 (*)    All  other components within normal limits  URINALYSIS, ROUTINE W REFLEX MICROSCOPIC - Abnormal; Notable for the following components:   Hgb urine dipstick SMALL (*)    All other components within normal limits  LIPASE, BLOOD  I-STAT BETA HCG BLOOD, ED ([facility], [facility], [facility] ONLY)  GC/CHLAMYDIA PROBE AMP ([place]) NOT [facility]    EKG None  Radiology [place] Transvaginal Non-ob  Result Date: 12/04/2018 CLINICAL DATA:  Initial evaluation for acute left adnexal pain for 1 day. EXAM: TRANSABDOMINAL AND TRANSVAGINAL ULTRASOUND OF PELVIS DOPPLER ULTRASOUND OF OVARIES TECHNIQUE: Both  transabdominal and transvaginal ultrasound examinations of the pelvis were performed. Transabdominal technique was performed for global imaging of the pelvis including uterus, ovaries, adnexal regions, and pelvic cul-de-sac. It was necessary to proceed with endovaginal exam following the transabdominal exam to visualize the uterus, endometrium, and ovaries. Color and duplex Doppler ultrasound was utilized to evaluate blood flow to the ovaries. COMPARISON:  None. FINDINGS: Uterus Measurements: 7.2 x 4.9 x 6.0 cm = volume: 9.5 mL. No fibroids or other mass visualized. Endometrium Thickness: 12.9 mm.  No focal abnormality visualized. Right ovary Measurements: 1.9 x 1.8 x 2.3 cm = volume: 4.0 mL. Normal appearance/no adnexal mass. Left ovary Measurements: 2.6 x 1.6 x 2.9 cm = volume: 6.4 mL. Normal appearance/no adnexal mass. Pulsed Doppler evaluation of both ovaries demonstrates normal low-resistance arterial and venous waveforms. Other findings No abnormal free fluid. IMPRESSION: Normal pelvic ultrasound. No evidence for ovarian torsion or other acute abnormality. Electronically Signed   By: Rise MuBenjamin  McClintock M.D.   On: 12/04/2018 20:13   [place] Pelvis Complete  Result Date: 12/04/2018 CLINICAL DATA:  Initial evaluation for acute left adnexal pain for 1 day. EXAM: TRANSABDOMINAL AND TRANSVAGINAL ULTRASOUND OF PELVIS DOPPLER ULTRASOUND OF OVARIES  TECHNIQUE: Both transabdominal and transvaginal ultrasound examinations of the pelvis were performed. Transabdominal technique was performed for global imaging of the pelvis including uterus, ovaries, adnexal regions, and pelvic cul-de-sac. It was necessary to proceed with endovaginal exam following the transabdominal exam to visualize the uterus, endometrium, and ovaries. Color and duplex Doppler ultrasound was utilized to evaluate blood flow to the ovaries. COMPARISON:  None. FINDINGS: Uterus Measurements: 7.2 x 4.9 x 6.0 cm = volume: 9.5 mL. No fibroids or other mass visualized. Endometrium Thickness: 12.9 mm.  No focal abnormality visualized. Right ovary Measurements: 1.9 x 1.8 x 2.3 cm = volume: 4.0 mL. Normal appearance/no adnexal mass. Left ovary Measurements: 2.6 x 1.6 x 2.9 cm = volume: 6.4 mL. Normal appearance/no adnexal mass. Pulsed Doppler evaluation of both ovaries demonstrates normal low-resistance arterial and venous waveforms. Other findings No abnormal free fluid. IMPRESSION: Normal pelvic ultrasound. No evidence for ovarian torsion or other acute abnormality. Electronically Signed   By: Rise MuBenjamin  McClintock M.D.   On: 12/04/2018 20:13   [place] Art/ven Flow Abd Pelv Doppler  Result Date: 12/04/2018 CLINICAL DATA:  Initial evaluation for acute left adnexal pain for 1 day. EXAM: TRANSABDOMINAL AND TRANSVAGINAL ULTRASOUND OF PELVIS DOPPLER ULTRASOUND OF OVARIES TECHNIQUE: Both transabdominal and transvaginal ultrasound examinations of the pelvis were performed. Transabdominal technique was performed for global imaging of the pelvis including uterus, ovaries, adnexal regions, and pelvic cul-de-sac. It was necessary to proceed with endovaginal exam following the transabdominal exam to visualize the uterus, endometrium, and ovaries. Color and duplex Doppler ultrasound was utilized to evaluate blood flow to the ovaries. COMPARISON:  None. FINDINGS: Uterus Measurements: 7.2 x 4.9 x 6.0 cm = volume: 9.5 mL.  No fibroids or other mass visualized. Endometrium Thickness: 12.9 mm.  No focal abnormality visualized. Right ovary Measurements: 1.9 x 1.8 x 2.3 cm = volume: 4.0 mL. Normal appearance/no adnexal mass. Left ovary Measurements: 2.6 x 1.6 x 2.9 cm = volume: 6.4 mL. Normal appearance/no adnexal mass. Pulsed Doppler evaluation of both ovaries demonstrates normal low-resistance arterial and venous waveforms. Other findings No abnormal free fluid. IMPRESSION: Normal pelvic ultrasound. No evidence for ovarian torsion or other acute abnormality. Electronically Signed   By: Rise MuBenjamin  McClintock M.D.   On: 12/04/2018 20:13   Ct Renal Stone Study  Result Date:  12/04/2018 CLINICAL DATA:  Flank pain, mid back pain, vomiting EXAM: CT ABDOMEN AND PELVIS WITHOUT CONTRAST TECHNIQUE: Multidetector CT imaging of the abdomen and pelvis was performed following the standard protocol without IV contrast. COMPARISON:  09/25/2016 FINDINGS: Lower chest: No acute abnormality. Hepatobiliary: No solid liver abnormality is seen. Gallstones. No gallbladder wall thickening, or biliary dilatation. Pancreas: Unremarkable. No pancreatic ductal dilatation or surrounding inflammatory changes. Spleen: Normal in size without significant abnormality. Adrenals/Urinary Tract: Adrenal glands are unremarkable. Tiny nonobstructive calculus of the inferior pole of the right kidney. No ureteral calculus or hydronephrosis. Bladder is unremarkable. Stomach/Bowel: Stomach is within normal limits. Status post appendectomy no evidence of bowel wall thickening, distention, or inflammatory changes. Vascular/Lymphatic: No significant vascular findings are present. No enlarged abdominal or pelvic lymph nodes. Reproductive: No mass or other significant abnormality. Other: No abdominal wall hernia or abnormality. No abdominopelvic ascites. Musculoskeletal: No acute or significant osseous findings. IMPRESSION: 1. Tiny nonobstructive calculus of the inferior pole of the  right kidney. No ureteral calculus or hydronephrosis. 2. Cholelithiasis. No gallbladder wall thickening or biliary ductal dilatation. Electronically Signed   By: Lauralyn Primes M.D.   On: 12/04/2018 20:29    Procedures Procedures (including critical care time)  Medications Ordered in ED Medications  cefTRIAXone (ROCEPHIN) injection 250 mg (has no administration in time range)  sodium chloride flush (NS) 0.9 % injection 3 mL (3 mLs Intravenous Given 12/04/18 1811)  ondansetron (ZOFRAN-ODT) disintegrating tablet 4 mg (4 mg Oral Given 12/04/18 1811)  metoCLOPramide (REGLAN) injection 5 mg (5 mg Intravenous Given 12/04/18 1823)  ketorolac (TORADOL) 30 MG/ML injection 30 mg (30 mg Intravenous Given 12/04/18 1820)  sodium chloride 0.9 % bolus 1,000 mL (1,000 mLs Intravenous Bolus from Bag 12/04/18 1815)     Initial Impression / Assessment and Plan / ED Course  I have reviewed the triage vital signs and the nursing notes.  Pertinent labs & imaging results that were available during my care of the patient were reviewed by me and considered in my medical decision making (see chart for details).    Final Clinical Impressions(s) / ED Diagnoses   Final diagnoses:  Left flank pain  Pelvic pain  Bacterial vaginosis   Pt is a 28 y/o female with a h/o kidney stones presents to the ED for eval of abd pain and flank pain. Started yesterday. Reports associated NV. Has pelvic pain and dysuria as well. Denies concern for STD.   Vitals are reassuring. On exam does have left flank, left abd, and bilat lower abd tenderness. Voluntary guarding present. No rebound TTP. Pelvic exam with uterine and left adnexal ttp, mild CMT. No discharge noted.   CBC with mild leukocytosis at 12.4. CMP normal electrolytes.  Normal liver and kidney function. Lipase negative Beta hCG negative Wet prep consistent with BV GC/chlamydia obtained. UA with hematuria but no leukocytes or nitrites to suggest infection.  Pelvic  ultrasound not show any evidence of torsion.  CT renal stone did not show any evidence of left-sided ureteral stone.  Etiology of patient's flank pain is not completely clear.  She may have passed a stone which would explain by her her pain somewhat improved however she did have some tenderness on her pelvic exam and does have evidence of BV.  Given her CMT, uterine and adnexal tenderness, will cover her for potential PID.  Have advised to follow with OB/GYN and return to the ER for new or worsening symptoms.  She voiced understanding the plan and reasons return.  All questions answered.  Patient stable discharge.  ED Discharge Orders         Ordered    doxycycline (VIBRAMYCIN) 100 MG capsule  2 times daily     12/04/18 2103    metroNIDAZOLE (FLAGYL) 500 MG tablet  2 times daily     12/04/18 2103           Rodney Booze, PA-C 12/04/18 2106    Varney Biles, MD 12/05/18 1436

## 2018-12-04 NOTE — ED Triage Notes (Signed)
Pt states last night she started having mid back pain but went away 2 hours ago the pain in her back returned and was more intense now radiates into your abd. Pt is also vomiting has vomited 3 times.

## 2018-12-04 NOTE — ED Notes (Signed)
Patient transported to Ultrasound 

## 2018-12-06 LAB — GC/CHLAMYDIA PROBE AMP (~~LOC~~) NOT AT ARMC
Chlamydia: NEGATIVE
Neisseria Gonorrhea: NEGATIVE

## 2018-12-09 ENCOUNTER — Other Ambulatory Visit: Payer: Self-pay | Admitting: Internal Medicine

## 2018-12-09 DIAGNOSIS — Z20822 Contact with and (suspected) exposure to covid-19: Secondary | ICD-10-CM

## 2018-12-11 LAB — NOVEL CORONAVIRUS, NAA: SARS-CoV-2, NAA: NOT DETECTED

## 2019-08-11 ENCOUNTER — Other Ambulatory Visit: Payer: Self-pay

## 2019-08-11 ENCOUNTER — Ambulatory Visit: Payer: HRSA Program | Attending: Internal Medicine

## 2019-08-11 DIAGNOSIS — Z20822 Contact with and (suspected) exposure to covid-19: Secondary | ICD-10-CM | POA: Diagnosis present

## 2019-08-12 LAB — NOVEL CORONAVIRUS, NAA: SARS-CoV-2, NAA: NOT DETECTED

## 2019-08-12 LAB — SARS-COV-2, NAA 2 DAY TAT

## 2021-03-21 ENCOUNTER — Ambulatory Visit
Admission: EM | Admit: 2021-03-21 | Discharge: 2021-03-21 | Disposition: A | Discharge status: Home / Self Care | Payer: Medicaid Other | Attending: Family Medicine | Admitting: Family Medicine

## 2021-03-21 DIAGNOSIS — R112 Nausea with vomiting, unspecified: Secondary | ICD-10-CM

## 2021-03-21 DIAGNOSIS — M549 Dorsalgia, unspecified: Secondary | ICD-10-CM

## 2021-03-21 DIAGNOSIS — R1010 Upper abdominal pain, unspecified: Secondary | ICD-10-CM

## 2021-03-21 LAB — POCT URINALYSIS DIP (MANUAL ENTRY)
Glucose, UA: NEGATIVE mg/dL
Leukocytes, UA: NEGATIVE
Nitrite, UA: NEGATIVE
Protein Ur, POC: 30 mg/dL — AB
Spec Grav, UA: 1.025 (ref 1.010–1.025)
Urobilinogen, UA: 0.2 E.U./dL
pH, UA: 5.5 (ref 5.0–8.0)

## 2021-03-21 MED ORDER — ONDANSETRON 4 MG PO TBDP: 4.0000 mg | ORAL_TABLET | Freq: Three times a day (TID) | ORAL | 0 refills | Status: AC | PRN

## 2021-03-21 MED ORDER — ONDANSETRON 4 MG PO TBDP
4.0000 mg | ORAL_TABLET | Freq: Once | ORAL | Status: AC
  Administered 2021-03-21: 4 mg via ORAL

## 2021-03-21 MED ORDER — TRAMADOL HCL 50 MG PO TABS: 50.0000 mg | ORAL_TABLET | Freq: Two times a day (BID) | ORAL | 0 refills | Status: DC | PRN

## 2021-03-21 NOTE — ED Triage Notes (Signed)
Pt presents with c/o left flank pain with nausea and vomiting for past week

## 2021-03-21 NOTE — ED Provider Notes (Signed)
[facility]    CSN: [id number] Arrival date & time: 03/21/21  0914      History   Chief Complaint Chief Complaint  Patient presents with   Back Pain   Nausea    HPI Brandi Hoffman is a 30 y.o. female.   Patient presenting today with about a week of nausea, vomiting, diarrhea, chills, possibly a low-grade fever here and there.  She states she is also been having some bilateral mid back pain and upper back pain which she states is not super uncommon because of her scoliosis.  She does have a history of kidney stones but states this does not feel similar to those.  She denies dysuria, hematuria, vaginal symptoms or pelvic symptoms, significant abdominal pain, body aches, chills, upper respiratory symptoms.  Has been taking some leftover pain medication that she had at home and some Tylenol with minimal relief.  The past 2 days has been able to tolerate p.o. without issue and only minimal occasional vomiting.  She is status post appendectomy.   Past Medical History:  Diagnosis Date   ADHD (attention deficit hyperactivity disorder)    GERD (gastroesophageal reflux disease)    Pregnant    Scoliosis     Patient Active Problem List   Diagnosis Date Noted   Acute tonsillitis due to infectious mononucleosis 05/29/2018   Acute gangrenous appendicitis 05/06/2015   NSVD (normal spontaneous vaginal delivery) 05/08/2013   Normal pregnancy 05/07/2013    Past Surgical History:  Procedure Laterality Date   LAPAROSCOPIC APPENDECTOMY N/A 05/06/2015   Procedure: APPENDECTOMY LAPAROSCOPIC;  Surgeon: Glenna Fellows, MD;  Location: [facility];  Service: General;  Laterality: N/A;   MULTIPLE TOOTH EXTRACTIONS      OB History     Gravida  2   Para  2   Term  2   Preterm      AB      Living  2      SAB      IAB      Ectopic      Multiple      Live Births  2            Home Medications    Prior to Admission medications   Medication Sig Start Date End  Date Taking? Authorizing Provider  ondansetron (ZOFRAN ODT) 4 MG disintegrating tablet Take 1 tablet (4 mg total) by mouth every 8 (eight) hours as needed for nausea or vomiting. 03/21/21  Yes Particia Nearing, PA-C  traMADol (ULTRAM) 50 MG tablet Take 1 tablet (50 mg total) by mouth 2 (two) times daily as needed. 03/21/21  Yes Particia Nearing, PA-C    Family History Family History  Adopted: Yes    Social History Social History   Tobacco Use   Smoking status: Former   Smokeless tobacco: Never  Building services engineer Use: Every day  Substance Use Topics   Alcohol use: No   Drug use: No     Allergies   Patient has no known allergies.   Review of Systems Review of Systems Per HPI  Physical Exam Triage Vital Signs ED Triage Vitals  Enc Vitals Group     BP 03/21/21 1132 116/76     Pulse Rate 03/21/21 1132 83     Resp 03/21/21 1132 20     Temp 03/21/21 1132 98.2 F (36.8 C)     Temp src --      SpO2 03/21/21 1132 98 %  Weight --      Height --      Head Circumference --      Peak Flow --      Pain Score 03/21/21 1130 6     Pain Loc --      Pain Edu? --      Excl. in [place]? --    No data found.  Updated Vital Signs BP 116/76   Pulse 83   Temp 98.2 F (36.8 C)   Resp 20   LMP 02/24/2021   SpO2 98%   Visual Acuity Right Eye Distance:   Left Eye Distance:   Bilateral Distance:    Right Eye Near:   Left Eye Near:    Bilateral Near:     Physical Exam Vitals and nursing note reviewed.  Constitutional:      Appearance: Normal appearance. She is not ill-appearing.  HENT:     Head: Atraumatic.     Mouth/Throat:     Mouth: Mucous membranes are moist.  Eyes:     Extraocular Movements: Extraocular movements intact.     Conjunctiva/sclera: Conjunctivae normal.  Cardiovascular:     Rate and Rhythm: Normal rate and regular rhythm.     Heart sounds: Normal heart sounds.  Pulmonary:     Effort: Pulmonary effort is normal.     Breath sounds:  Normal breath sounds. No wheezing or rales.  Abdominal:     General: Bowel sounds are normal.     Palpations: Abdomen is soft.     Tenderness: There is abdominal tenderness. There is right CVA tenderness and left CVA tenderness. There is no guarding.     Comments: Mild left upper quadrant and epigastric tenderness to palpation without guarding or distention.  Bowel sounds normoactive.  Musculoskeletal:        General: Normal range of motion.     Cervical back: Normal range of motion and neck supple.  Skin:    General: Skin is warm and dry.  Neurological:     Mental Status: She is alert and oriented to person, place, and time.  Psychiatric:        Mood and Affect: Mood normal.        Thought Content: Thought content normal.        Judgment: Judgment normal.     UC Treatments / Results  Labs (all labs ordered are listed, but only abnormal results are displayed) Labs Reviewed  POCT URINALYSIS DIP (MANUAL ENTRY) - Abnormal; Notable for the following components:      Result Value   Bilirubin, UA small (*)    Ketones, POC UA trace (5) (*)    Blood, UA trace-intact (*)    Protein Ur, POC =30 (*)    All other components within normal limits  CBC WITH DIFFERENTIAL/PLATELET  COMPREHENSIVE METABOLIC PANEL  LIPASE    EKG   Radiology No results found.  Procedures Procedures (including critical care time)  Medications Ordered in UC Medications  ondansetron (ZOFRAN-ODT) disintegrating tablet 4 mg (4 mg Oral Given 03/21/21 1202)    Initial Impression / Assessment and Plan / UC Course  I have reviewed the triage vital signs and the nursing notes.  Pertinent labs & imaging results that were available during my care of the patient were reviewed by me and considered in my medical decision making (see chart for details).     Zofran administered in clinic given her active nausea, vital signs and exam overall reassuring with no red flag findings.  Possibly a GI virus in addition to  muscular soreness from her scoliosis or activity but given hematuria on UA today and history of kidney stones could possibly represent some pain from kidney stones.  We will treat with Zofran, tramadol for as needed pain relief, brat diet, fluids while awaiting CBC, CMP, lipase for safety check.  Strict ED precautions given for acutely worsening symptoms.  Final Clinical Impressions(s) / UC Diagnoses   Final diagnoses:  Pain of upper abdomen  Nausea and vomiting, unspecified vomiting type  Mid back pain     Discharge Instructions      Go to the emergency department if your symptoms worsen at any time.     ED Prescriptions     Medication Sig Dispense Auth. Provider   ondansetron (ZOFRAN ODT) 4 MG disintegrating tablet Take 1 tablet (4 mg total) by mouth every 8 (eight) hours as needed for nausea or vomiting. 20 tablet Volney American, [place]   traMADol (ULTRAM) 50 MG tablet Take 1 tablet (50 mg total) by mouth 2 (two) times daily as needed. 10 tablet Volney American, [place]      I have reviewed the PDMP during this encounter.   Volney American, [place] 03/21/21 1226

## 2021-03-21 NOTE — Discharge Instructions (Signed)
Go to the emergency department if your symptoms worsen at any time. °

## 2021-03-23 LAB — CBC WITH DIFFERENTIAL/PLATELET
Basophils Absolute: 0 10*3/uL (ref 0.0–0.2)
Basos: 0 %
EOS (ABSOLUTE): 0.2 10*3/uL (ref 0.0–0.4)
Eos: 2 %
Hematocrit: 41.8 % (ref 34.0–46.6)
Hemoglobin: 14 g/dL (ref 11.1–15.9)
Immature Grans (Abs): 0.1 10*3/uL (ref 0.0–0.1)
Immature Granulocytes: 1 %
Lymphocytes Absolute: 2.3 10*3/uL (ref 0.7–3.1)
Lymphs: 22 %
MCH: 30.3 pg (ref 26.6–33.0)
MCHC: 33.5 g/dL (ref 31.5–35.7)
MCV: 91 fL (ref 79–97)
Monocytes Absolute: 0.6 10*3/uL (ref 0.1–0.9)
Monocytes: 6 %
Neutrophils Absolute: 7.1 10*3/uL — ABNORMAL HIGH (ref 1.4–7.0)
Neutrophils: 69 %
Platelets: 356 10*3/uL (ref 150–450)
RBC: 4.62 x10E6/uL (ref 3.77–5.28)
RDW: 12.2 % (ref 11.7–15.4)
WBC: 10.2 10*3/uL (ref 3.4–10.8)

## 2021-03-23 LAB — COMPREHENSIVE METABOLIC PANEL
ALT: 34 IU/L — ABNORMAL HIGH (ref 0–32)
AST: 25 IU/L (ref 0–40)
Albumin/Globulin Ratio: 1.5 (ref 1.2–2.2)
Albumin: 4.3 g/dL (ref 3.9–5.0)
Alkaline Phosphatase: 88 IU/L (ref 44–121)
BUN/Creatinine Ratio: 11 (ref 9–23)
BUN: 7 mg/dL (ref 6–20)
Bilirubin Total: <0.2 mg/dL (ref 0.0–1.2)
CO2: 19 mmol/L — ABNORMAL LOW (ref 20–29)
Calcium: 8.9 mg/dL (ref 8.7–10.2)
Chloride: 104 mmol/L (ref 96–106)
Creatinine, Ser: 0.64 mg/dL (ref 0.57–1.00)
Globulin, Total: 2.9 g/dL (ref 1.5–4.5)
Glucose: 80 mg/dL (ref 70–99)
Potassium: 4.3 mmol/L (ref 3.5–5.2)
Sodium: 139 mmol/L (ref 134–144)
Total Protein: 7.2 g/dL (ref 6.0–8.5)
eGFR: 123 mL/min/{1.73_m2} (ref >59)

## 2021-03-23 LAB — LIPASE: Lipase: 55 U/L (ref 14–72)

## 2021-04-15 ENCOUNTER — Emergency Department (HOSPITAL_COMMUNITY): Payer: Self-pay | Admitting: Certified Registered"

## 2021-04-15 ENCOUNTER — Emergency Department (HOSPITAL_COMMUNITY): Payer: Self-pay

## 2021-04-15 ENCOUNTER — Encounter (HOSPITAL_COMMUNITY): Payer: Self-pay | Admitting: Emergency Medicine

## 2021-04-15 ENCOUNTER — Encounter (HOSPITAL_COMMUNITY)
Admission: EM | Disposition: A | Discharge status: Home / Self Care | Payer: Self-pay | Source: Home / Self Care | Attending: Emergency Medicine

## 2021-04-15 ENCOUNTER — Observation Stay (HOSPITAL_COMMUNITY)
Admission: EM | Admit: 2021-04-15 | Discharge: 2021-04-16 | Disposition: A | Discharge status: Home / Self Care | Payer: Self-pay | Attending: Surgery | Admitting: Surgery

## 2021-04-15 ENCOUNTER — Other Ambulatory Visit: Payer: Self-pay

## 2021-04-15 DIAGNOSIS — T886XXA Anaphylactic reaction due to adverse effect of correct drug or medicament properly administered, initial encounter: Secondary | ICD-10-CM | POA: Insufficient documentation

## 2021-04-15 DIAGNOSIS — T7840XA Allergy, unspecified, initial encounter: Secondary | ICD-10-CM | POA: Insufficient documentation

## 2021-04-15 DIAGNOSIS — Z87891 Personal history of nicotine dependence: Secondary | ICD-10-CM | POA: Insufficient documentation

## 2021-04-15 DIAGNOSIS — K8020 Calculus of gallbladder without cholecystitis without obstruction: Secondary | ICD-10-CM

## 2021-04-15 DIAGNOSIS — K8000 Calculus of gallbladder with acute cholecystitis without obstruction: Principal | ICD-10-CM | POA: Insufficient documentation

## 2021-04-15 DIAGNOSIS — K810 Acute cholecystitis: Secondary | ICD-10-CM

## 2021-04-15 DIAGNOSIS — Z20822 Contact with and (suspected) exposure to covid-19: Secondary | ICD-10-CM | POA: Insufficient documentation

## 2021-04-15 DIAGNOSIS — R1011 Right upper quadrant pain: Secondary | ICD-10-CM

## 2021-04-15 DIAGNOSIS — T782XXA Anaphylactic shock, unspecified, initial encounter: Secondary | ICD-10-CM

## 2021-04-15 HISTORY — PX: CHOLECYSTECTOMY: SHX55

## 2021-04-15 LAB — URINALYSIS, ROUTINE W REFLEX MICROSCOPIC
Bilirubin Urine: NEGATIVE
Glucose, UA: NEGATIVE mg/dL
Ketones, ur: NEGATIVE mg/dL
Leukocytes,Ua: NEGATIVE
Nitrite: NEGATIVE
Protein, ur: NEGATIVE mg/dL
Specific Gravity, Urine: 1.015 (ref 1.005–1.030)
pH: 6.5 (ref 5.0–8.0)

## 2021-04-15 LAB — HEPATIC FUNCTION PANEL
ALT: 17 U/L (ref 0–44)
AST: 16 U/L (ref 15–41)
Albumin: 3.8 g/dL (ref 3.5–5.0)
Alkaline Phosphatase: 62 U/L (ref 38–126)
Bilirubin, Direct: <0.1 mg/dL (ref 0.0–0.2)
Total Bilirubin: 0.1 mg/dL — ABNORMAL LOW (ref 0.3–1.2)
Total Protein: 7.6 g/dL (ref 6.5–8.1)

## 2021-04-15 LAB — BASIC METABOLIC PANEL
Anion gap: 7 (ref 5–15)
BUN: 7 mg/dL (ref 6–20)
CO2: 24 mmol/L (ref 22–32)
Calcium: 9.1 mg/dL (ref 8.9–10.3)
Chloride: 104 mmol/L (ref 98–111)
Creatinine, Ser: 0.58 mg/dL (ref 0.44–1.00)
GFR, Estimated: >60 mL/min (ref >60)
Glucose, Bld: 111 mg/dL — ABNORMAL HIGH (ref 70–99)
Potassium: 4.1 mmol/L (ref 3.5–5.1)
Sodium: 135 mmol/L (ref 135–145)

## 2021-04-15 LAB — RESP PANEL BY RT-PCR (FLU A&B, COVID) ARPGX2
Influenza A by PCR: NEGATIVE
Influenza B by PCR: NEGATIVE
SARS Coronavirus 2 by RT PCR: NEGATIVE

## 2021-04-15 LAB — CBC
HCT: 42.1 % (ref 36.0–46.0)
Hemoglobin: 13.7 g/dL (ref 12.0–15.0)
MCH: 29.6 pg (ref 26.0–34.0)
MCHC: 32.5 g/dL (ref 30.0–36.0)
MCV: 90.9 fL (ref 80.0–100.0)
Platelets: 396 10*3/uL (ref 150–400)
RBC: 4.63 MIL/uL (ref 3.87–5.11)
RDW: 12.2 % (ref 11.5–15.5)
WBC: 11.9 10*3/uL — ABNORMAL HIGH (ref 4.0–10.5)
nRBC: 0 % (ref 0.0–0.2)

## 2021-04-15 LAB — I-STAT BETA HCG BLOOD, ED (MC, WL, AP ONLY): I-stat hCG, quantitative: <5 m[IU]/mL (ref <5)

## 2021-04-15 LAB — URINALYSIS, MICROSCOPIC (REFLEX)

## 2021-04-15 LAB — TROPONIN I (HIGH SENSITIVITY)
Troponin I (High Sensitivity): <2 ng/L (ref <18)
Troponin I (High Sensitivity): 4 ng/L (ref <18)

## 2021-04-15 LAB — LIPASE, BLOOD: Lipase: 30 U/L (ref 11–51)

## 2021-04-15 SURGERY — LAPAROSCOPIC CHOLECYSTECTOMY
Anesthesia: General | Site: Abdomen

## 2021-04-15 MED ORDER — FENTANYL CITRATE (PF) 100 MCG/2ML IJ SOLN
INTRAMUSCULAR | Status: DC | PRN
  Administered 2021-04-15: 100 ug via INTRAVENOUS
  Administered 2021-04-15 (×2): 50 ug via INTRAVENOUS

## 2021-04-15 MED ORDER — SIMETHICONE 80 MG PO CHEW: 40.0000 mg | CHEWABLE_TABLET | Freq: Four times a day (QID) | ORAL | Status: DC | PRN

## 2021-04-15 MED ORDER — HYDROMORPHONE HCL 1 MG/ML IJ SOLN
0.2500 mg | INTRAMUSCULAR | Status: DC | PRN
  Administered 2021-04-15: 0.5 mg via INTRAVENOUS

## 2021-04-15 MED ORDER — SODIUM CHLORIDE 0.9 % IR SOLN
Status: DC | PRN
  Administered 2021-04-15: 300 mL

## 2021-04-15 MED ORDER — ENOXAPARIN SODIUM 40 MG/0.4ML IJ SOSY
40.0000 mg | PREFILLED_SYRINGE | INTRAMUSCULAR | Status: DC
  Filled 2021-04-15: qty 0.4

## 2021-04-15 MED ORDER — ROCURONIUM BROMIDE 10 MG/ML (PF) SYRINGE
PREFILLED_SYRINGE | INTRAVENOUS | Status: DC | PRN
  Administered 2021-04-15: 50 mg via INTRAVENOUS

## 2021-04-15 MED ORDER — OXYCODONE HCL 5 MG/5ML PO SOLN: 5.0000 mg | Freq: Once | ORAL | Status: DC | PRN

## 2021-04-15 MED ORDER — DIPHENHYDRAMINE HCL 50 MG/ML IJ SOLN
INTRAMUSCULAR | Status: AC
  Filled 2021-04-15: qty 1

## 2021-04-15 MED ORDER — ACETAMINOPHEN 500 MG PO TABS
1000.0000 mg | ORAL_TABLET | Freq: Four times a day (QID) | ORAL | Status: DC
  Administered 2021-04-15 – 2021-04-16 (×3): 1000 mg via ORAL
  Filled 2021-04-15 (×3): qty 2

## 2021-04-15 MED ORDER — MELATONIN 3 MG PO TABS: 3.0000 mg | ORAL_TABLET | Freq: Every evening | ORAL | Status: DC | PRN

## 2021-04-15 MED ORDER — IBUPROFEN 600 MG PO TABS: 600.0000 mg | ORAL_TABLET | Freq: Four times a day (QID) | ORAL | Status: DC | PRN

## 2021-04-15 MED ORDER — CIPROFLOXACIN IN D5W 400 MG/200ML IV SOLN
400.0000 mg | Freq: Once | INTRAVENOUS | Status: AC
  Administered 2021-04-15: 400 mg via INTRAVENOUS
  Filled 2021-04-15: qty 200

## 2021-04-15 MED ORDER — ONDANSETRON HCL 4 MG/2ML IJ SOLN: 4.0000 mg | INTRAMUSCULAR | Status: DC | PRN

## 2021-04-15 MED ORDER — SUCCINYLCHOLINE CHLORIDE 200 MG/10ML IV SOSY
PREFILLED_SYRINGE | INTRAVENOUS | Status: DC | PRN
  Administered 2021-04-15: 100 mg via INTRAVENOUS

## 2021-04-15 MED ORDER — METOPROLOL TARTRATE 5 MG/5ML IV SOLN: 5.0000 mg | Freq: Four times a day (QID) | INTRAVENOUS | Status: DC | PRN

## 2021-04-15 MED ORDER — DROPERIDOL 2.5 MG/ML IJ SOLN: 0.6250 mg | Freq: Once | INTRAMUSCULAR | Status: DC | PRN

## 2021-04-15 MED ORDER — FENTANYL CITRATE (PF) 100 MCG/2ML IJ SOLN: 25.0000 ug | INTRAMUSCULAR | Status: DC | PRN

## 2021-04-15 MED ORDER — MIDAZOLAM HCL 5 MG/5ML IJ SOLN
INTRAMUSCULAR | Status: DC | PRN
  Administered 2021-04-15: 2 mg via INTRAVENOUS

## 2021-04-15 MED ORDER — SCOPOLAMINE 1 MG/3DAYS TD PT72: 1.0000 | MEDICATED_PATCH | TRANSDERMAL | Status: DC

## 2021-04-15 MED ORDER — PROPOFOL 10 MG/ML IV BOLUS
INTRAVENOUS | Status: DC | PRN
  Administered 2021-04-15: 160 mg via INTRAVENOUS

## 2021-04-15 MED ORDER — METHOCARBAMOL 750 MG PO TABS: 750.0000 mg | ORAL_TABLET | Freq: Four times a day (QID) | ORAL | 1 refills | Status: AC

## 2021-04-15 MED ORDER — PROMETHAZINE HCL 25 MG/ML IJ SOLN: 6.2500 mg | INTRAMUSCULAR | Status: DC | PRN

## 2021-04-15 MED ORDER — MIDAZOLAM HCL 2 MG/2ML IJ SOLN
INTRAMUSCULAR | Status: AC
  Filled 2021-04-15: qty 2

## 2021-04-15 MED ORDER — METHYLPREDNISOLONE SODIUM SUCC 125 MG IJ SOLR
100.0000 mg | Freq: Once | INTRAMUSCULAR | Status: AC
  Administered 2021-04-15: 100 mg via INTRAVENOUS
  Filled 2021-04-15: qty 2

## 2021-04-15 MED ORDER — ACETAMINOPHEN 500 MG PO TABS
1000.0000 mg | ORAL_TABLET | ORAL | Status: AC
  Administered 2021-04-15: 1000 mg via ORAL
  Filled 2021-04-15: qty 2

## 2021-04-15 MED ORDER — HYDROMORPHONE HCL 1 MG/ML IJ SOLN
INTRAMUSCULAR | Status: AC
  Administered 2021-04-15: 0.5 mg via INTRAVENOUS
  Filled 2021-04-15: qty 1

## 2021-04-15 MED ORDER — IBUPROFEN 600 MG PO TABS: 600.0000 mg | ORAL_TABLET | Freq: Four times a day (QID) | ORAL | 1 refills | Status: AC | PRN

## 2021-04-15 MED ORDER — ORAL CARE MOUTH RINSE: 15.0000 mL | Freq: Once | OROMUCOSAL | Status: AC

## 2021-04-15 MED ORDER — LACTATED RINGERS IV SOLN: INTRAVENOUS | Status: DC

## 2021-04-15 MED ORDER — OXYCODONE HCL 5 MG PO TABS: 5.0000 mg | ORAL_TABLET | Freq: Three times a day (TID) | ORAL | 0 refills | Status: AC | PRN

## 2021-04-15 MED ORDER — PROPOFOL 10 MG/ML IV BOLUS
INTRAVENOUS | Status: AC
  Filled 2021-04-15: qty 20

## 2021-04-15 MED ORDER — CELECOXIB 200 MG PO CAPS
200.0000 mg | ORAL_CAPSULE | ORAL | Status: AC
  Administered 2021-04-15: 200 mg via ORAL
  Filled 2021-04-15: qty 1

## 2021-04-15 MED ORDER — OXYCODONE HCL 5 MG PO TABS
5.0000 mg | ORAL_TABLET | ORAL | Status: DC | PRN
  Administered 2021-04-15 – 2021-04-16 (×3): 10 mg via ORAL
  Filled 2021-04-15 (×3): qty 2

## 2021-04-15 MED ORDER — DIPHENHYDRAMINE HCL 50 MG/ML IJ SOLN: 25.0000 mg | Freq: Four times a day (QID) | INTRAMUSCULAR | Status: DC | PRN

## 2021-04-15 MED ORDER — BUPIVACAINE HCL (PF) 0.25 % IJ SOLN
INTRAMUSCULAR | Status: AC
  Filled 2021-04-15: qty 30

## 2021-04-15 MED ORDER — SODIUM CHLORIDE 0.9 % IV BOLUS
1000.0000 mL | Freq: Once | INTRAVENOUS | Status: AC
  Administered 2021-04-15: 1000 mL via INTRAVENOUS

## 2021-04-15 MED ORDER — DOCUSATE SODIUM 100 MG PO CAPS: 100.0000 mg | ORAL_CAPSULE | Freq: Two times a day (BID) | ORAL | 2 refills | Status: AC

## 2021-04-15 MED ORDER — FENTANYL CITRATE PF 50 MCG/ML IJ SOSY
50.0000 ug | PREFILLED_SYRINGE | Freq: Once | INTRAMUSCULAR | Status: AC
  Administered 2021-04-15: 50 ug via INTRAVENOUS
  Filled 2021-04-15: qty 1

## 2021-04-15 MED ORDER — MORPHINE SULFATE (PF) 2 MG/ML IV SOLN
1.0000 mg | INTRAVENOUS | Status: DC | PRN
Start: 2021-04-15 — End: 2021-04-16
  Administered 2021-04-15: 2 mg via INTRAVENOUS
  Filled 2021-04-15: qty 1

## 2021-04-15 MED ORDER — SUGAMMADEX SODIUM 200 MG/2ML IV SOLN
INTRAVENOUS | Status: DC | PRN
  Administered 2021-04-15: 500 mg via INTRAVENOUS

## 2021-04-15 MED ORDER — BUPIVACAINE HCL 0.25 % IJ SOLN
INTRAMUSCULAR | Status: DC | PRN
  Administered 2021-04-15: 30 mL

## 2021-04-15 MED ORDER — SODIUM CHLORIDE 0.9 % IV SOLN
2.0000 g | Freq: Once | INTRAVENOUS | Status: AC
  Administered 2021-04-15: 2 g via INTRAVENOUS
  Filled 2021-04-15: qty 20

## 2021-04-15 MED ORDER — DEXMEDETOMIDINE (PRECEDEX) IN NS 20 MCG/5ML (4 MCG/ML) IV SYRINGE
PREFILLED_SYRINGE | INTRAVENOUS | Status: DC | PRN
  Administered 2021-04-15: 8 ug via INTRAVENOUS

## 2021-04-15 MED ORDER — FENTANYL CITRATE (PF) 250 MCG/5ML IJ SOLN
INTRAMUSCULAR | Status: AC
  Filled 2021-04-15: qty 5

## 2021-04-15 MED ORDER — SCOPOLAMINE 1 MG/3DAYS TD PT72
MEDICATED_PATCH | TRANSDERMAL | Status: AC
  Administered 2021-04-15: 1.5 mg via TRANSDERMAL
  Filled 2021-04-15: qty 1

## 2021-04-15 MED ORDER — OXYCODONE HCL 5 MG PO TABS: 5.0000 mg | ORAL_TABLET | Freq: Once | ORAL | Status: DC | PRN

## 2021-04-15 MED ORDER — CHLORHEXIDINE GLUCONATE 0.12 % MT SOLN: 15.0000 mL | Freq: Once | OROMUCOSAL | Status: AC

## 2021-04-15 MED ORDER — ESMOLOL HCL 100 MG/10ML IV SOLN
INTRAVENOUS | Status: DC | PRN
  Administered 2021-04-15 (×2): 30 mg via INTRAVENOUS

## 2021-04-15 MED ORDER — LIDOCAINE 2% (20 MG/ML) 5 ML SYRINGE
INTRAMUSCULAR | Status: DC | PRN
  Administered 2021-04-15: 60 mg via INTRAVENOUS

## 2021-04-15 MED ORDER — CHLORHEXIDINE GLUCONATE 0.12 % MT SOLN
OROMUCOSAL | Status: AC
  Administered 2021-04-15: 15 mL via OROMUCOSAL
  Filled 2021-04-15: qty 15

## 2021-04-15 MED ORDER — KCL IN DEXTROSE-NACL 20-5-0.45 MEQ/L-%-% IV SOLN
INTRAVENOUS | Status: DC
  Filled 2021-04-15: qty 1000

## 2021-04-15 MED ORDER — DIPHENHYDRAMINE HCL 25 MG PO CAPS: 25.0000 mg | ORAL_CAPSULE | Freq: Four times a day (QID) | ORAL | Status: DC | PRN

## 2021-04-15 SURGICAL SUPPLY — 35 items
APPLIER CLIP 5 13 M/L LIGAMAX5 (MISCELLANEOUS) ×2
BLADE CLIPPER SURG (BLADE) IMPLANT
CANISTER SUCT 3000ML PPV (MISCELLANEOUS) ×2 IMPLANT
CHLORAPREP W/TINT 26 (MISCELLANEOUS) ×2 IMPLANT
CLIP APPLIE 5 13 M/L LIGAMAX5 (MISCELLANEOUS) ×1 IMPLANT
COVER SURGICAL LIGHT HANDLE (MISCELLANEOUS) ×2 IMPLANT
DERMABOND ADVANCED (GAUZE/BANDAGES/DRESSINGS) ×1
DERMABOND ADVANCED .7 DNX12 (GAUZE/BANDAGES/DRESSINGS) ×1 IMPLANT
DISSECTOR BLUNT TIP ENDO 5MM (MISCELLANEOUS) IMPLANT
ELECT CAUTERY BLADE 6.4 (BLADE) ×2 IMPLANT
ELECT REM PT RETURN 9FT ADLT (ELECTROSURGICAL) ×2
ELECTRODE REM PT RTRN 9FT ADLT (ELECTROSURGICAL) ×1 IMPLANT
GLOVE SURG ENC MOIS LTX SZ6.5 (GLOVE) ×2 IMPLANT
GLOVE SURG UNDER POLY LF SZ6 (GLOVE) ×2 IMPLANT
GOWN STRL REUS W/ TWL LRG LVL3 (GOWN DISPOSABLE) ×3 IMPLANT
GOWN STRL REUS W/TWL LRG LVL3 (GOWN DISPOSABLE) ×3
KIT BASIN OR (CUSTOM PROCEDURE TRAY) ×2 IMPLANT
KIT TURNOVER KIT B (KITS) ×2 IMPLANT
NS IRRIG 1000ML POUR BTL (IV SOLUTION) ×2 IMPLANT
PAD ARMBOARD 7.5X6 YLW CONV (MISCELLANEOUS) ×2 IMPLANT
PENCIL BUTTON HOLSTER BLD 10FT (ELECTRODE) ×2 IMPLANT
POUCH RETRIEVAL ECOSAC 10 (ENDOMECHANICALS) ×1 IMPLANT
POUCH RETRIEVAL ECOSAC 10MM (ENDOMECHANICALS) ×1
SCISSORS LAP 5X35 DISP (ENDOMECHANICALS) ×2 IMPLANT
SET IRRIG TUBING LAPAROSCOPIC (IRRIGATION / IRRIGATOR) ×2 IMPLANT
SET TUBE SMOKE EVAC HIGH FLOW (TUBING) ×2 IMPLANT
SLEEVE ENDOPATH XCEL 5M (ENDOMECHANICALS) ×4 IMPLANT
SUT MNCRL AB 4-0 PS2 18 (SUTURE) ×2 IMPLANT
SUT VIC AB 0 UR5 27 (SUTURE) ×2 IMPLANT
SUT VICRYL 0 AB UR-6 (SUTURE) IMPLANT
TOWEL GREEN STERILE FF (TOWEL DISPOSABLE) ×2 IMPLANT
TRAY LAPAROSCOPIC MC (CUSTOM PROCEDURE TRAY) ×2 IMPLANT
TROCAR XCEL BLUNT TIP 100MML (ENDOMECHANICALS) ×2 IMPLANT
TROCAR XCEL NON-BLD 5MMX100MML (ENDOMECHANICALS) ×2 IMPLANT
WATER STERILE IRR 1000ML POUR (IV SOLUTION) ×2 IMPLANT

## 2021-04-15 NOTE — Progress Notes (Signed)
Patient seen and examined. Plan for lap chole. Ultrasound reviewed. Informed consent was obtained after detailed explanation of risks, including bleeding, infection, biloma, hematoma, injury to common bile duct, need for IOC to delineate anatomy, and need for conversion to open procedure. All questions answered to the patient's satisfaction.  Diamantina Monks, MD General and Trauma Surgery [facility]

## 2021-04-15 NOTE — Anesthesia Preprocedure Evaluation (Signed)
Anesthesia Evaluation  Patient identified by MRN, date of birth, ID band Patient awake    Reviewed: Allergy & Precautions, H&P , NPO status , Patient's Chart, lab work & pertinent test results  Airway Mallampati: III  TM Distance: >3 FB Neck ROM: Full  Mouth opening: Limited Mouth Opening  Dental no notable dental hx.    Pulmonary neg pulmonary ROS, Patient abstained from smoking., former smoker,    Pulmonary exam normal breath sounds clear to auscultation       Cardiovascular negative cardio ROS Normal cardiovascular exam Rhythm:Regular Rate:Normal     Neuro/Psych negative neurological ROS  negative psych ROS   GI/Hepatic Neg liver ROS, GERD  Medicated,  Endo/Other  negative endocrine ROS  Renal/GU negative Renal ROS  negative genitourinary   Musculoskeletal negative musculoskeletal ROS (+)   Abdominal   Peds negative pediatric ROS (+)  Hematology negative hematology ROS (+)   Anesthesia Other Findings   Reproductive/Obstetrics negative OB ROS                             Anesthesia Physical Anesthesia Plan  ASA: 2 and emergent  Anesthesia Plan: General   Post-op Pain Management: Tylenol PO (pre-op)   Induction:   PONV Risk Score and Plan: Scopolamine patch - Pre-op, Treatment may vary due to age or medical condition, Ondansetron, Dexamethasone and Midazolam  Airway Management Planned: Oral ETT  Additional Equipment: None  Intra-op Plan:   Post-operative Plan: Extubation in OR  Informed Consent: I have reviewed the patients History and Physical, chart, labs and discussed the procedure including the risks, benefits and alternatives for the proposed anesthesia with the patient or authorized representative who has indicated his/her understanding and acceptance.     Dental advisory given  Plan Discussed with: CRNA and Anesthesiologist  Anesthesia Plan Comments:          Anesthesia Quick Evaluation

## 2021-04-15 NOTE — Transfer of Care (Signed)
Immediate Anesthesia Transfer of Care Note  Patient: Brandi Hoffman  Procedure(s) Performed: LAPAROSCOPIC CHOLECYSTECTOMY (Abdomen)  Patient Location: PACU  Anesthesia Type:General  Level of Consciousness: awake and drowsy  Airway & Oxygen Therapy: Patient Spontanous Breathing  Post-op Assessment: Report given to RN and Post -op Vital signs reviewed and stable  Post vital signs: Reviewed and stable  Last Vitals:  Vitals Value Taken Time  BP 118/68 04/15/21 1623  Temp    Pulse 104 04/15/21 1631  Resp 19 04/15/21 1631  SpO2 100 % 04/15/21 1631  Vitals shown include unvalidated device data.  Last Pain:  Vitals:   04/15/21 1407  TempSrc: Oral  PainSc: 2          Complications: No notable events documented.

## 2021-04-15 NOTE — ED Provider Notes (Addendum)
[facility] EMERGENCY DEPARTMENT Provider Note   CSN: [id number] Arrival date & time: 04/15/21  9147     History Chief Complaint  Patient presents with   Chest Pain   Shortness of Breath    Brandi Hoffman is a 30 y.o. female history includes obesity, GERD, scoliosis.  Patient present for evaluation of right lower chest/right upper quadrant pain, pain began last night shortly after eating dinner, she reports pain as sharp severe constant but waxes and wanes, no clear inciting event.  Patient reports pain is occasionally worsened with certain movements or with deep breath, no clear alleviating factors.  Patient reports she had a similar episode of pain around 2 weeks ago, she reports she went to an urgent care and they told her it could be gallstones and that she should go to the ER if it got worse, patient reports that episode resolved.  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  Patient denies fall, injury, fever, chills, cough/hemoptysis, exogenous hormone use, recent surgery/immobilization, extremity swelling/color change, history of blood clot, numbness/tingling, weakness, saddle or paresthesias, incontinence, retention, vomiting, diarrhea or any additional concerns.  HPI     Past Medical History:  Diagnosis Date   ADHD (attention deficit hyperactivity disorder)    GERD (gastroesophageal reflux disease)    Pregnant    Scoliosis     Patient Active Problem List   Diagnosis Date Noted   Acute tonsillitis due to infectious mononucleosis 05/29/2018   Acute gangrenous appendicitis 05/06/2015   NSVD (normal spontaneous vaginal delivery) 05/08/2013   Normal pregnancy 05/07/2013    Past Surgical History:  Procedure Laterality Date   LAPAROSCOPIC APPENDECTOMY N/A  05/06/2015   Procedure: APPENDECTOMY LAPAROSCOPIC;  Surgeon: Glenna Fellows, MD;  Location: [facility];  Service: General;  Laterality: N/A;   MULTIPLE TOOTH EXTRACTIONS       OB History     Gravida  2   Para  2   Term  2   Preterm      AB      Living  2      SAB      IAB      Ectopic      Multiple      Live Births  2           Family History  Adopted: Yes    Social History   Tobacco Use   Smoking status: Former   Smokeless tobacco: Never  Building services engineer Use: Every day  Substance Use Topics   Alcohol use: No   Drug use: No    Home Medications Prior to Admission medications   Medication Sig Start Date End Date Taking? Authorizing Provider  ibuprofen (ADVIL) 200 MG tablet Take 200 mg by mouth every 6 (six) hours as needed for moderate pain.   Yes [provider]  ondansetron (ZOFRAN ODT) 4 MG disintegrating tablet Take 1 tablet (4 mg total) by mouth every 8 (eight) hours as needed for nausea or vomiting. 03/21/21  Yes Particia Nearing, PA-C  traMADol (ULTRAM) 50 MG tablet Take 1 tablet (50 mg total) by mouth 2 (two) times daily as needed. Patient not taking: Reported on 04/15/2021 03/21/21   Particia Nearing, PA-C    Allergies    Patient has no known allergies.  Review of Systems   Review of Systems Ten systems are reviewed and are negative for acute change except as noted in the HPI  Physical Exam Updated Vital Signs BP 113/68   Pulse 91   Temp 98.6 F (37 C) (Oral)   Resp (!) 22   Ht 5\' 5"  (1.651 m)   Wt 81.6 kg   LMP 04/08/2021 (Approximate)   SpO2 100%   BMI 29.95 kg/m   Physical Exam Constitutional:      General: She is not in acute distress.    Appearance: Normal appearance. She is well-developed. She is not ill-appearing or diaphoretic.  HENT:     Head: Normocephalic and atraumatic.  Eyes:     General: Vision grossly intact. Gaze aligned appropriately.     Pupils: Pupils are equal, round, and  reactive to light.  Neck:     Trachea: Trachea and phonation normal.  Cardiovascular:     Rate and Rhythm: Normal rate and regular rhythm.     Pulses:          Radial pulses are 2+ on the right side and 2+ on the left side.       Dorsalis pedis pulses are 2+ on the right side and 2+ on the left side.  Pulmonary:     Effort: Pulmonary effort is normal. No respiratory distress.     Breath sounds: Normal breath sounds and air entry.  Chest:     Chest wall: Tenderness present. No deformity or crepitus.    Abdominal:     General: There is no distension.     Palpations: Abdomen is soft.     Tenderness: There is abdominal tenderness in the right upper quadrant. There is no guarding or rebound. Positive signs include Murphy's sign. Negative signs include McBurney's sign.  Musculoskeletal:        General: Normal range of motion.     Cervical back: Normal range of motion.     Right lower leg: No edema.     Left lower leg: No edema.  Skin:    General: Skin is warm and dry.  Neurological:     Mental Status: She is alert.     GCS: GCS eye subscore is 4. GCS verbal subscore is 5. GCS motor subscore is 6.     Comments: Speech is clear and goal oriented, follows commands Major Cranial nerves without deficit, no facial droop Moves extremities without ataxia, coordination intact  Psychiatric:        Behavior: Behavior normal.    ED Results / Procedures / Treatments   Labs (all labs ordered are listed, but only abnormal results are displayed) Labs Reviewed  BASIC METABOLIC PANEL - Abnormal; Notable for the following components:      Result Value   Glucose, Bld 111 (*)    All other components within normal limits  CBC - Abnormal; Notable for the following components:   WBC 11.9 (*)    All other components within normal limits  HEPATIC FUNCTION PANEL - Abnormal; Notable for the following components:   Total Bilirubin 0.1 (*)    All other components within normal limits  URINALYSIS,  ROUTINE W REFLEX MICROSCOPIC - Abnormal; Notable for the following components:   Hgb urine dipstick TRACE (*)    All other components within normal limits  URINALYSIS, MICROSCOPIC (REFLEX) - Abnormal; Notable for the following components:   Bacteria, UA RARE (*)    All other components within normal limits  RESP PANEL BY RT-PCR (  FLU A&B, COVID) ARPGX2  LIPASE, BLOOD  I-STAT BETA HCG BLOOD, ED ([facility], [facility], [facility] ONLY)  TROPONIN I (HIGH SENSITIVITY)  TROPONIN I (HIGH SENSITIVITY)    EKG EKG Interpretation  Date/Time:  Friday April 15 2021 06:48:12 EST Ventricular Rate:  88 PR Interval:  162 QRS Duration: 64 QT Interval:  354 QTC Calculation: 428 R Axis:   16 Text Interpretation: Normal sinus rhythm with sinus arrhythmia Anterior infarct , age undetermined Abnormal ECG Since last tracing Rate slower Confirmed by Susy Frizzle [phone]) on 04/15/2021 7:50:10 AM  Radiology DG Chest 2 View  Result Date: 04/15/2021 CLINICAL DATA:  Chest pain under the right breast. EXAM: CHEST - 2 VIEW COMPARISON:  05/29/2018 FINDINGS: The heart size and mediastinal contours are within normal limits. Both lungs are clear. The visualized skeletal structures are unremarkable. IMPRESSION: No active cardiopulmonary disease. Electronically Signed   By: Elige Ko M.D.   On: 04/15/2021 08:16   US Abdomen Limited RUQ (LIVER/GB)  Result Date: 04/15/2021 CLINICAL DATA:  RIGHT upper quadrant pain in a 30 year old female. EXAM: ULTRASOUND ABDOMEN LIMITED RIGHT UPPER QUADRANT COMPARISON:  CT imaging from 2020. FINDINGS: Gallbladder: Gallstones mixed with sludge in the dependent aspect of the gallbladder. No gallbladder wall thickening. No visible pericholecystic fluid but with reported tenderness over the gallbladder. Common bile duct: Diameter: 5.2 mm Liver: Mildly heterogeneous echotexture. Question mildly nodular liver contour. Portal vein is patent on color Doppler imaging with normal direction of blood flow towards  the liver. Other: Mildly limited due to body habitus. IMPRESSION: Cholelithiasis with tenderness over the gallbladder reported at the time of the exam. Findings are suspicious for acute cholecystitis in the appropriate clinical setting but without wall thickening or pericholecystic fluid. HIDA scan could be useful as warranted to add specificity. Question of developing liver disease with mildly nodular hepatic contour. Mild heterogeneity of hepatic echotexture as well. Correlate with any clinical or laboratory evidence of hepatic dysfunction. Electronically Signed   By: Donzetta Kohut M.D.   On: 04/15/2021 10:02    Procedures Procedures   Medications Ordered in ED Medications  cefTRIAXone (ROCEPHIN) 2 g in sodium chloride 0.9 % 100 mL IVPB (2 g Intravenous New Bag/Given 04/15/21 1048)  fentaNYL (SUBLIMAZE) injection 50 mcg (has no administration in time range)  sodium chloride 0.9 % bolus 1,000 mL (1,000 mLs Intravenous New Bag/Given 04/15/21 1048)    ED Course  I have reviewed the triage vital signs and the nursing notes.  Pertinent labs & imaging results that were available during my care of the patient were reviewed by me and considered in my medical decision making (see chart for details).  Clinical Course as of 04/15/21 1102  Fri Apr 15, 2021  3329 Patient not in room for eval [BM]  [address] [facility] [BM]    Clinical Course User Index [BM] Elizabeth Palau   MDM Rules/Calculators/A&P                           Additional history obtained from: Nursing notes from this visit. Review of electronic medical records. ------------------------------ 30 year old female presented for right upper quadrant abdominal pain/right lower chest pain began after dinner last night.  Similar episode 2 weeks ago, she was seen in urgent care and prescribed tramadol as well as Zofran and symptoms resolved.  No prior imaging.  History and examination is concerning for gallstones, chest  pain work-up initiated in triage.  Basic labs have returned,  patient has a mild leukocytosis of 11.9, no anemia or thrombocytopenia.  BMP shows no emergent electrolyte derangement, AKI or gap.  Pregnancy test is negative.  Initial troponin is negative.  I have added on lipase and LFTs, I have ordered a right upper quadrant ultrasound to assess for cholecystitis.  Chest x-ray interpreted by radiologist as no active cardiopulmonary disease.  EKG reviewed by EDMD without acute changes from prior.  Patient is low risk by Wells criteria and PERC negative; low suspicion for pulmonary embolism as etiology of right upper quadrant abdominal pain/right lower chest pain. ================================ I reviewed and interpreted labs which include: COVID/influenza panel negative. Pregnancy test negative. CBC shows leukocytosis of 11.9, no anemia or thrombocytopenia. LFTs without emergent elevations. BMP without emergent electrolyte derangement, AKI or gap. Urinalysis without evidence for UTI. Initial and delta high-sensitivity troponins within normal limits. (<2 - 4) Lipase within normal limits.  DG Chest:    IMPRESSION:  No active cardiopulmonary disease.   EKG: Normal sinus rhythm with sinus arrhythmia Anterior infarct , age undetermined Abnormal ECG Since last tracing Rate slower Confirmed by Susy Frizzle [phone]) on 04/15/2021 7:50:10 AM  RUQ [place]:  IMPRESSION:  Cholelithiasis with tenderness over the gallbladder reported at the  time of the exam. Findings are suspicious for acute cholecystitis in  the appropriate clinical setting but without wall thickening or  pericholecystic fluid. HIDA scan could be useful as warranted to add  specificity.     Question of developing liver disease with mildly nodular hepatic  contour. Mild heterogeneity of hepatic echotexture as well.  Correlate with any clinical or laboratory evidence of hepatic  dysfunction.   10:56 AM: Consult with general surgery  spoke with Remi Haggard.  Advises general surgery team will come down and admit the patient to their service, anticipate cholecystectomy. ----- Patient reassessed, she is resting comfortably bed no acute distress vital signs are stable.  Patient states understanding of results and plan of care today, she is agreeable for general surgery evaluation and admission for possible cholecystectomy.  She has no additional questions at this time.  IV fluids and IV Rocephin have been ordered.  Pain medication ordered. -------- 11:25: Informed by general surgery, Remi Haggard patient had allergic reaction to Rocephin.  Tresa Endo PA-C witnessed patient with shortness of breath, cough chest tightness, Rocephin was discontinued.  Solu-Medrol and Benadryl given to the patient by general surgery and RN.  Rocephin added to patient's allergy list.  I have reevaluated the patient she reports she is feeling much better now, no recurrence of her reaction. Will continue to monitor patient.  Note: Portions of this report may have been transcribed using voice recognition software. Every effort was made to ensure accuracy; however, inadvertent computerized transcription errors may still be present.  Final Clinical Impression(s) / ED Diagnoses Final diagnoses:  RUQ abdominal pain  Gallstones    Rx / DC Orders ED Discharge Orders     None        Bill Salinas, PA-C 04/15/21 1103    Elizabeth Palau 04/15/21 1132    Pollyann Savoy, MD 04/15/21 1148

## 2021-04-15 NOTE — Op Note (Signed)
   Operative Note  Date: 04/15/2021  Procedure: laparoscopic cholecystectomy  Pre-op diagnosis: acute cholecystitis Post-op diagnosis: Acute calculous cholecystitis with hydrops  Indication and clinical history: The patient is a 30 y.o. year old female with acute cholecystitis  Surgeon: Diamantina Monks, MD  Anesthesiologist: Maple Hudson, MD Anesthesia: General  Findings:  Specimen: gallbladder EBL: <5cc Drains/Implants: none  Disposition: PACU - hemodynamically stable.  Description of procedure: The patient was positioned supine on the operating room table. Time-out was performed verifying correct patient, procedure, signature of informed consent, and administration of pre-operative antibiotics. General anesthetic induction and intubation were uneventful. The abdomen was prepped and draped in the usual sterile fashion. An infra-umbilical incision was made using an open technique using zero vicryl stay sutures on either side of the fascia and a 32mm Hassan port inserted. After establishing pneumoperitoneum, which the patient tolerated well, the abdominal cavity was inspected and no injury of any intra-abdominal structures was identified. Additional ports were placed under direct visualization and using local anesthetic: two 44mm ports in the right subcostal region and a 62mm port in the epigastric region. The patient was re-positioned to reverse Trendelenburg and right side up. Adhesiolysis was performed to expose the gallbladder, which was then retracted cephalad after aspiration of clear fluid from the gallbladder. The infundibulum was identified and retracted toward the right lower quadrant. The peritoneum was incised over the infundibulum and the triangle of Calot dissected to expose the critical view of safety. With clear identification and isolation of the cystic duct and cystic artery, the cystic artery was doubly clipped and divided. After this, the cystic duct was identified as a single  structure entering the gallbladder, and was also doubly clipped and divided. The gallbladder was dissected off the liver bed using electrocautery and hemostasis of the liver bed was confirmed prior to separation of the final peritoneal attachments of the gallbladder to the liver bed. The gallbladder fossa was irrigated and fluid returned clear. After transection of the final peritoneal attachments, the gallbladder was placed in an endoscopic specimen retrieval bag, removed via the umbilical port site, and sent to pathology as a permanent specimen. The gallbladder fossa was inspected confirming hemostasis, the absence of bile leakage from the cystic duct stump, and correct placement of clips on the cystic artery and cystic duct stumps. The abdomen was desufflated and the fascia of the umbilical port site was closed using the previously placed stay sutures. Additional local anesthetic was administered at the umbilical port site.  The skin of all incisions was closed with 4-0 monocryl. Sterile dressings were applied. All sponge and instrument counts were correct at the conclusion of the procedure. The patient was awakened from anesthesia, extubated uneventfully, and transported to the PACU - hemodynamically stable.. There were no complications.    Upon entering the abdomen (organ space), I encountered infection of the gallbladder .  CASE DATA:  Type of patient?: DOW CASE (Surgical Hospitalist [facility])  Status of Case? URGENT Add On  Infection Present At Time Of Surgery (PATOS)?  INFECTION of the gallbladder    Diamantina Monks, MD General and Trauma Surgery [facility] Surgery

## 2021-04-15 NOTE — H&P (Signed)
History and Physical Exam  Brandi Hoffman 1991-03-23  [id number].    Chief Complaint/Reason for Consult: acute cholecystitis  HPI:  30 year old female with medical history significant of ADHD, GERD who presented to [facility] ED due to abdominal pain beginning last night after dinner. She had a similar prior episode approximately 2 weeks ago for which she was evaluated in UC with concern for possible gallstone etiology.  She has been having similar symptoms for the last 2 months with pain in her back. She can't tell that it is related to food or that anything makes it worse or better. She admits to nausea, but no vomiting.  She denies any fevers.  Work up in ED significant for RUQ US showing cholelithiasis with tenderness concerning for acute cholecystitis, WBC 11.9. Lipase and hepatic function within normal limits.  General surgery asked to evaluate in regard to cholelithiasis, possible cholecystitis. Currently she has ongoing abdominal pain though improved with medications. While present in her room, she began having an allergic reaction to her Rocephin.  This was stopped by myself and  of solu-medrol was administered.  She rapidly improved with both of these interventions.  Substance use: no alcohol, former tobacco use, vapes Allergies: nkda Blood thinners: none Past Surgeries: laparoscopic appendectomy  ROS: Review of Systems  Constitutional:  Negative for chills and fever.  Respiratory:  Negative for cough, shortness of breath and wheezing.   Cardiovascular:  Negative for chest pain, palpitations and leg swelling.  Gastrointestinal:  Positive for abdominal pain. Negative for constipation, diarrhea, nausea and vomiting.  Genitourinary: Negative.   Musculoskeletal:  Positive for back pain.   Family History  Adopted: Yes    Past Medical History:  Diagnosis Date   ADHD (attention deficit hyperactivity disorder)    GERD (gastroesophageal reflux disease)    Pregnant     Scoliosis     Past Surgical History:  Procedure Laterality Date   LAPAROSCOPIC APPENDECTOMY N/A 05/06/2015   Procedure: APPENDECTOMY LAPAROSCOPIC;  Surgeon: Glenna Fellows, MD;  Location: [facility] ORS;  Service: General;  Laterality: N/A;   MULTIPLE TOOTH EXTRACTIONS      Social History:  reports that she has quit smoking. She has never used smokeless tobacco. She reports that she does not drink alcohol and does not use drugs.  Allergies: No Known Allergies  (Not in a hospital admission)   Blood pressure 113/68, pulse 91, temperature 98.6 F (37 C), temperature source Oral, resp. rate (!) 22, height  (1.651 m), weight 81.6 kg, last menstrual period 04/08/2021, SpO2 100 %. Physical Exam: General: pleasant, WD, female who is laying in bed in NAD HEENT: head is normocephalic, atraumatic.  Sclera are noninjected.  Pupils equal and round. EOMs intact.  Ears and nose without any masses or lesions.  Mouth is pink and moist Heart: regular, rate, and rhythm.  Normal s1,s2. No obvious murmurs, gallops, or rubs noted.  Palpable radial and pedal pulses bilaterally Lungs: CTAB, no wheezes, rhonchi, or rales noted.  Respiratory effort nonlabored Abd: soft, ND, +BS, no masses, hernias, or organomegaly. Focal mild TTP over RUQ without rebound or guarding. MSK: all 4 extremities are symmetrical with no cyanosis, clubbing, or edema. Skin: warm and dry with no masses, lesions, or rashes Neuro: Cranial nerves 2-12 grossly intact, sensation is normal throughout Psych: A&Ox3 with an appropriate affect.    Results for orders placed or performed during the hospital encounter of 04/15/21 (from the past 48 hour(s))  Basic metabolic panel  Status: Abnormal   Collection Time: 04/15/21  6:58 AM  Result Value Ref Range   Sodium 135 135 - 145 mmol/L   Potassium 4.1 3.5 - 5.1 mmol/L   Chloride 104 98 - 111 mmol/L   CO2 24 22 - 32 mmol/L   Glucose, Bld 111 (H) 70 - 99 mg/dL    Comment: Glucose reference  range applies only to samples taken after fasting for at least 8 hours.   BUN 7 6 - 20 mg/dL   Creatinine, Ser 2.58 0.44 - 1.00 mg/dL   Calcium 9.1 8.9 - 52.7 mg/dL   GFR, Estimated >78 >24 mL/min    Comment: (NOTE) Calculated using the CKD-EPI Creatinine Equation (2021)    Anion gap 7 5 - 15    Comment: Performed at [facility], [address] [place], [place] [address]  CBC     Status: Abnormal   Collection Time: 04/15/21  6:58 AM  Result Value Ref Range   WBC 11.9 (H) 4.0 - 10.5 K/uL   RBC 4.63 3.87 - 5.11 MIL/uL   Hemoglobin 13.7 12.0 - 15.0 g/dL   HCT 14.4 31.5 - 40.0 %   MCV 90.9 80.0 - 100.0 fL   MCH 29.6 26.0 - 34.0 pg   MCHC 32.5 30.0 - 36.0 g/dL   RDW 86.7 61.9 - 50.9 %   Platelets 396 150 - 400 K/uL   nRBC 0.0 0.0 - 0.2 %    Comment: Performed at [facility], [address], [place], [place] [address]  Troponin I (High Sensitivity)     Status: None   Collection Time: 04/15/21  6:58 AM  Result Value Ref Range   Troponin I (High Sensitivity) <2 <18 ng/L    Comment: (NOTE) Elevated high sensitivity troponin I (hsTnI) values and significant  changes across serial measurements may suggest ACS but many other  chronic and acute conditions are known to elevate hsTnI results.  Refer to the "Links" section for chest pain algorithms and additional  guidance. Performed at [facility], [address], [place], [place] [address]   I-Stat beta hCG blood, ED     Status: None   Collection Time: 04/15/21  7:34 AM  Result Value Ref Range   I-stat hCG, quantitative <5.0 <5 mIU/mL   Comment 3            Comment:   GEST. AGE      CONC.  (mIU/mL)   <=1 WEEK        5 - 50     2 WEEKS       50 - 500     3 WEEKS       100 - 10,000     4 WEEKS     1,000 - 30,000        FEMALE AND NON-PREGNANT FEMALE:     LESS THAN 5 mIU/mL   Resp Panel by RT-PCR (Flu A&B, Covid) Nasopharyngeal Swab     Status: None   Collection Time: 04/15/21  8:02 AM   Specimen:  Nasopharyngeal Swab; Nasopharyngeal(NP) swabs in vial transport medium  Result Value Ref Range   SARS Coronavirus 2 by RT PCR NEGATIVE NEGATIVE    Comment: (NOTE) SARS-CoV-2 target nucleic acids are NOT DETECTED.  The SARS-CoV-2 RNA is generally detectable in upper respiratory specimens during the acute phase of infection. The lowest concentration of SARS-CoV-2 viral copies this assay can detect is 138 copies/mL. A negative result does not  preclude SARS-Cov-2 infection and should not be used as the sole basis for treatment or other patient management decisions. A negative result may occur with  improper specimen collection/handling, submission of specimen other than nasopharyngeal swab, presence of viral mutation(s) within the areas targeted by this assay, and inadequate number of viral copies(<138 copies/mL). A negative result must be combined with clinical observations, patient history, and epidemiological information. The expected result is Negative.  Fact Sheet for Patients:  BloggerCourse.com  Fact Sheet for Healthcare Providers:  SeriousBroker.it  This test is no t yet approved or cleared by the [place] FDA and  has been authorized for detection and/or diagnosis of SARS-CoV-2 by FDA under an Emergency Use Authorization (EUA). This EUA will remain  in effect (meaning this test can be used) for the duration of the COVID-19 declaration under Section 564(b)(1) of the Act, 21 U.S.C.section 360bbb-3(b)(1), unless the authorization is terminated  or revoked sooner.       Influenza A by PCR NEGATIVE NEGATIVE   Influenza B by PCR NEGATIVE NEGATIVE    Comment: (NOTE) The Xpert Xpress SARS-CoV-2/FLU/RSV plus assay is intended as an aid in the diagnosis of influenza from Nasopharyngeal swab specimens and should not be used as a sole basis for treatment. Nasal washings and aspirates are unacceptable for Xpert Xpress  SARS-CoV-2/FLU/RSV testing.  Fact Sheet for Patients: BloggerCourse.com  Fact Sheet for Healthcare Providers: SeriousBroker.it  This test is not yet approved or cleared by the [place] FDA and has been authorized for detection and/or diagnosis of SARS-CoV-2 by FDA under an Emergency Use Authorization (EUA). This EUA will remain in effect (meaning this test can be used) for the duration of the COVID-19 declaration under Section 564(b)(1) of the Act, 21 U.S.C. section 360bbb-3(b)(1), unless the authorization is terminated or revoked.  Performed at [facility], [address], [place], [place] [address]   Troponin I (High Sensitivity)     Status: None   Collection Time: 04/15/21  9:15 AM  Result Value Ref Range   Troponin I (High Sensitivity) 4 <18 ng/L    Comment: (NOTE) Elevated high sensitivity troponin I (hsTnI) values and significant  changes across serial measurements may suggest ACS but many other  chronic and acute conditions are known to elevate hsTnI results.  Refer to the "Links" section for chest pain algorithms and additional  guidance. Performed at [facility], [address] [place], [place] [address]   Hepatic function panel     Status: Abnormal   Collection Time: 04/15/21  9:15 AM  Result Value Ref Range   Total Protein 7.6 6.5 - 8.1 g/dL   Albumin 3.8 3.5 - 5.0 g/dL   AST 16 15 - 41 U/L   ALT 17 0 - 44 U/L   Alkaline Phosphatase 62 38 - 126 U/L   Total Bilirubin 0.1 (L) 0.3 - 1.2 mg/dL   Bilirubin, Direct <4.2 0.0 - 0.2 mg/dL   Indirect Bilirubin NOT CALCULATED 0.3 - 0.9 mg/dL    Comment: Performed at [facility], [address], [place], [place] [address]  Lipase, blood     Status: None   Collection Time: 04/15/21  9:15 AM  Result Value Ref Range   Lipase 30 11 - 51 U/L    Comment: Performed at [facility], [address] [place], [place] [address]  Urinalysis, Routine  w reflex microscopic Urine, Clean Catch     Status: Abnormal   Collection Time: 04/15/21  9:30 AM  Result Value Ref Range   Color, Urine YELLOW YELLOW   APPearance CLEAR CLEAR   Specific Gravity, Urine 1.015 1.005 - 1.030   pH 6.5 5.0 - 8.0   Glucose, UA NEGATIVE NEGATIVE mg/dL   Hgb urine dipstick TRACE (A) NEGATIVE   Bilirubin Urine NEGATIVE NEGATIVE   Ketones, ur NEGATIVE NEGATIVE mg/dL   Protein, ur NEGATIVE NEGATIVE mg/dL   Nitrite NEGATIVE NEGATIVE   Leukocytes,Ua NEGATIVE NEGATIVE    Comment: Performed at [facility], [address], [place], [place] [address]  Urinalysis, Microscopic (reflex)     Status: Abnormal   Collection Time: 04/15/21  9:30 AM  Result Value Ref Range   RBC / HPF 0-5 0 - 5 RBC/hpf   WBC, UA 0-5 0 - 5 WBC/hpf   Bacteria, UA RARE (A) NONE SEEN   Squamous Epithelial / LPF 0-5 0 - 5   Mucus PRESENT     Comment: Performed at [facility], [address], [place], [place] [address]   DG Chest 2 View  Result Date: 04/15/2021 CLINICAL DATA:  Chest pain under the right breast. EXAM: CHEST - 2 VIEW COMPARISON:  05/29/2018 FINDINGS: The heart size and mediastinal contours are within normal limits. Both lungs are clear. The visualized skeletal structures are unremarkable. IMPRESSION: No active cardiopulmonary disease. Electronically Signed   By: Elige Ko M.D.   On: 04/15/2021 08:16   US Abdomen Limited RUQ (LIVER/GB)  Result Date: 04/15/2021 CLINICAL DATA:  RIGHT upper quadrant pain in a 30 year old female. EXAM: ULTRASOUND ABDOMEN LIMITED RIGHT UPPER QUADRANT COMPARISON:  CT imaging from 2020. FINDINGS: Gallbladder: Gallstones mixed with sludge in the dependent aspect of the gallbladder. No gallbladder wall thickening. No visible pericholecystic fluid but with reported tenderness over the gallbladder. Common bile duct: Diameter: 5.2 mm Liver: Mildly heterogeneous echotexture. Question mildly nodular liver contour. Portal vein is patent on color  Doppler imaging with normal direction of blood flow towards the liver. Other: Mildly limited due to body habitus. IMPRESSION: Cholelithiasis with tenderness over the gallbladder reported at the time of the exam. Findings are suspicious for acute cholecystitis in the appropriate clinical setting but without wall thickening or pericholecystic fluid. HIDA scan could be useful as warranted to add specificity. Question of developing liver disease with mildly nodular hepatic contour. Mild heterogeneity of hepatic echotexture as well. Correlate with any clinical or laboratory evidence of hepatic dysfunction. Electronically Signed   By: Donzetta Kohut M.D.   On: 04/15/2021 10:02      Assessment/Plan Cholelithiasis, possible acute cholecystitis - RUQ US showing cholelithiasis with tenderness concerning for acute cholecystitis, WBC 11.9. Lipase and hepatic function within normal limits. - plan for cholecystectomy today - I have explained the procedure, risks, and aftercare of cholecystectomy.  Risks include but are not limited to bleeding, infection, wound problems, anesthesia, diarrhea, bile leak, injury to common bile duct/liver/intestine.  She seems to understand and agrees to proceed. - possible discharge from PACU  Anaphylaxis to rocephin -treated with solu-medrol and stopping of offending agent.  -rapid improvement -will list as allergy -give Cipro as prophylactic dose for OR. -monitor for a couple hours prior to surgery to assure as steroid wears off she has no new return of symptoms.  FEN: NPO ID: rocephin in ED, but stopped due to anaphylaxis.  Cipro ordered VTE: none currently   Letha Cape, [facility] Surgery 04/15/2021, 11:22 AM Please see Amion for pager number during day hours 7:00am-4:30pm

## 2021-04-15 NOTE — ED Triage Notes (Signed)
Patient here with chest pain under right breast.  Patient does have some shortness of breath with it.  She states that she has had episodes of this going on and off.  She also has been having severe back pain, that she has not been able to sleep, work or take care of children due to the pain.

## 2021-04-15 NOTE — Discharge Instructions (Addendum)
[facility]  Please arrive at least 30 min before your appointment to complete your check in paperwork.  If you are unable to arrive 30 min prior to your appointment time we may have to cancel or reschedule you. LAPAROSCOPIC SURGERY: POST OP INSTRUCTIONS Always review your discharge instruction sheet given to you by the facility where your surgery was performed. IF YOU HAVE DISABILITY OR FAMILY LEAVE FORMS, YOU MUST BRING THEM TO THE OFFICE FOR PROCESSING.   DO NOT GIVE THEM TO YOUR DOCTOR.  May shower beginning 04/16/2021. Do not peel off or scrub skin glue. May allow warm soapy water to run over incision, then rinse and pat dry. Do not soak in any water (tubs, hot tubs, pools, lakes, oceans) for one week.    Restrictions  no lifting over 15 lbs until 05/02/21.   No lifting over 40lbs until 05/16/21.   At that time 05/16/2021 , you may resume all normal activities.  Call the office at [phone] for temperature greater than 101.93F, worsening pain, redness or warmth at the incision site.  Please call [phone] to make an appointment for 2-3 weeks after surgery for wound check.    PAIN CONTROL  Taking acetaminophen (Tylenol) and/or ibuprofen (Advil) regularly after surgery will help to control your pain and lower the amount of prescription pain medication you may need.  You should not take more than 4,000 mg (4 grams) of acetaminophen (Tylenol) in 24 hours.  You should not take ibuprofen (Advil), aleve, motrin, naprosyn or other NSAIDS if you have a history of stomach ulcers or chronic kidney disease.  A prescription for pain medication may be given to you upon discharge.  Take your pain medication as prescribed, if you still have uncontrolled pain after taking acetaminophen (Tylenol) or ibuprofen (Advil). Use ice packs to help control pain. If you need a refill on your pain medication, please contact your pharmacy.  They will contact our office to request  authorization. Prescriptions will not be filled after 5pm or on week-ends.  HOME MEDICATIONS Take your usually prescribed medications unless otherwise directed.  DIET You should follow a light diet the first few days after arrival home.  Be sure to include lots of fluids daily. Avoid fatty, fried foods.   CONSTIPATION It is common to experience some constipation after surgery and if you are taking pain medication.  Increasing fluid intake and taking a stool softener (such as Colace) will usually help or prevent this problem from occurring.  A mild laxative (Milk of Magnesia or Miralax) should be taken according to package instructions if there are no bowel movements after 48 hours.  WOUND/INCISION CARE Most patients will experience some swelling and bruising in the area of the incisions.  Ice packs will help.  Swelling and bruising can take   ACTIVITIES You may resume regular (light) daily activities beginning the next day--such as daily self-care, walking, climbing stairs--gradually increasing activities as tolerated.  You may have sexual intercourse when it is comfortable.  Refrain from any heavy lifting or straining until approved by your doctor. You may drive when you are no longer taking prescription pain medication, you can comfortably wear a seatbelt, and you can safely maneuver your car and apply brakes.  FOLLOW-UP You should see your doctor in the office for a follow-up appointment approximately 2-3 weeks after your surgery.  You should have been given your post-op/follow-up appointment when your surgery was scheduled.  If you did not receive a post-op/follow-up appointment, make  sure that you call for this appointment within a day or two after you arrive home to insure a convenient appointment time.   WHEN TO CALL YOUR DOCTOR: Fever over 101.5 Inability to urinate Continued bleeding from incision. Increased pain, redness, or drainage from the incision. Increasing abdominal  pain  The clinic staff is available to answer your questions during regular business hours.  Please don't hesitate to call and ask to speak to one of the nurses for clinical concerns.  If you have a medical emergency, go to the nearest emergency room or call 911.  A surgeon from [facility] is always on call at the hospital. [address], [address], [place], [place]  [address] ? [address] [address], [place], [place]   [address] [phone] ? [phone] ? FAX [phone]     Managing Your Pain After Surgery Without Opioids    Thank you for participating in our program to help patients manage their pain after surgery without opioids. This is part of our effort to provide you with the best care possible, without exposing you or your family to the risk that opioids pose.  What pain can I expect after surgery? You can expect to have some pain after surgery. This is normal. The pain is typically worse the day after surgery, and quickly begins to get better. Many studies have found that many patients are able to manage their pain after surgery with Over-the-Counter (OTC) medications such as Tylenol and Motrin. If you have a condition that does not allow you to take Tylenol or Motrin, notify your surgical team.  How will I manage my pain? The best strategy for controlling your pain after surgery is around the clock pain control with Tylenol (acetaminophen) and Motrin (ibuprofen or Advil) and Robaxin (methocarbamol). Alternating these medications with each other allows you to maximize your pain control. In addition to Tylenol, Motrin, and Robaxin, you can use heating pads or ice packs on your incisions to help reduce your pain.   Pain regimen: take over-the-counter tylenol (acetaminophen) 1000mg  every six hours, the prescription ibuprofen (600mg ) every six hours and the robaxin (methocarbamol) 750mg  every six hours. With all three of these, you should be taking something every two  hours. Example: tylenol ( acetaminophen) at 8am, ibuprofen at 10am, robaxin (methocarbamol) at 12pm, tylenol (acetaminophen) again at 2pm, ibuprofen again at 4pm, robaxin (methocarbamol) at 6pm. You also have a prescription for oxycodone, which should be taken if the tylenol (acetaminophen), ibuprofen, and robaxin (methocarbamol) are not enough to control your pain. You may take the oxycodone as frequently as every four hours as needed, but if you are taking the other medications as above, you should not need the oxycodone this frequently. You have also been given a prescription for colace (docusate) which is a stool softener. Please take this as prescribed because the oxycodone can cause constipation and the colace (docusate) will minimize or prevent constipation.  Use the table on the last page of this handout to keep track of the medications you are taking. Important: Do not take more than 4000mg  of Tylenol or 3200mg  of Motrin in a 24-hour period. Do not take ibuprofen/Motrin if you have a history of bleeding stomach ulcers, severe kidney disease, &/or actively taking a blood thinner  What if I still have pain? If you have pain that is not controlled with the over-the-counter pain medications (Tylenol and Motrin or Advil) you might have what we call "breakthrough" pain. You will receive a prescription  for a small amount of an opioid pain medication such as Oxycodone, Tramadol, or Tylenol with Codeine. Use these opioid pills in the first 24 hours after surgery if you have breakthrough pain. Do not take more than 1 pill every 4-6 hours.  If you still have uncontrolled pain after using all opioid pills, don't hesitate to call our staff using the number provided. We will help make sure you are managing your pain in the best way possible, and if necessary, we can provide a prescription for additional pain medication.   Day 1    Time  Name of Medication Number of pills taken  Amount of Acetaminophen   Pain Level   Comments  AM PM       AM PM       AM PM       AM PM       AM PM       AM PM       AM PM       AM PM       Total Daily amount of Acetaminophen Do not take more than  3,000 mg per day      Day 2    Time  Name of Medication Number of pills taken  Amount of Acetaminophen  Pain Level   Comments  AM PM       AM PM       AM PM       AM PM       AM PM       AM PM       AM PM       AM PM       Total Daily amount of Acetaminophen Do not take more than  3,000 mg per day      Day 3    Time  Name of Medication Number of pills taken  Amount of Acetaminophen  Pain Level   Comments  AM PM       AM PM       AM PM       AM PM          AM PM       AM PM       AM PM       AM PM       Total Daily amount of Acetaminophen Do not take more than  3,000 mg per day      Day 4    Time  Name of Medication Number of pills taken  Amount of Acetaminophen  Pain Level   Comments  AM PM       AM PM       AM PM       AM PM       AM PM       AM PM       AM PM       AM PM       Total Daily amount of Acetaminophen Do not take more than  3,000 mg per day      Day 5    Time  Name of Medication Number of pills taken  Amount of Acetaminophen  Pain Level   Comments  AM PM       AM PM       AM PM       AM PM       AM PM       AM  PM       AM PM       AM PM       Total Daily amount of Acetaminophen Do not take more than  3,000 mg per day       Day 6    Time  Name of Medication Number of pills taken  Amount of Acetaminophen  Pain Level  Comments  AM PM       AM PM       AM PM       AM PM       AM PM       AM PM       AM PM       AM PM       Total Daily amount of Acetaminophen Do not take more than  3,000 mg per day      Day 7    Time  Name of Medication Number of pills taken  Amount of Acetaminophen  Pain Level   Comments  AM PM       AM PM       AM PM       AM PM       AM PM       AM PM       AM PM       AM PM        Total Daily amount of Acetaminophen Do not take more than  3,000 mg per day        For additional information about how and where to safely dispose of unused opioid medications - [email or web address]  Disclaimer: This document contains information and/or instructional materials adapted from [facility] for the typical patient with your condition. It does not replace medical advice from your health care provider because your experience may differ from that of the typical patient. Talk to your health care provider if you have any questions about this document, your condition or your treatment plan. Adapted from [facility]

## 2021-04-15 NOTE — ED Notes (Signed)
Patient transported to Ultrasound 

## 2021-04-15 NOTE — Anesthesia Procedure Notes (Signed)
Procedure Name: Intubation Date/Time: 04/15/2021 3:18 PM Performed by: Lynnell Chad, CRNA Pre-anesthesia Checklist: Patient identified, Emergency Drugs available, Suction available and Patient being monitored Patient Re-evaluated:Patient Re-evaluated prior to induction Oxygen Delivery Method: Circle System Utilized Preoxygenation: Pre-oxygenation with 100% oxygen Induction Type: IV induction Ventilation: Mask ventilation without difficulty Laryngoscope Size: Miller and 2 Grade View: Grade I Tube type: Oral Tube size: 7.0 mm Number of attempts: 1 Airway Equipment and Method: Stylet and Oral airway Placement Confirmation: ETT inserted through vocal cords under direct vision, positive ETCO2 and breath sounds checked- equal and bilateral Secured at: 22 cm Tube secured with: Tape Dental Injury: Teeth and Oropharynx as per pre-operative assessment

## 2021-04-16 ENCOUNTER — Encounter (HOSPITAL_COMMUNITY): Payer: Self-pay

## 2021-04-16 DIAGNOSIS — T782XXA Anaphylactic shock, unspecified, initial encounter: Secondary | ICD-10-CM

## 2021-04-16 LAB — HIV ANTIBODY (ROUTINE TESTING W REFLEX): HIV Screen 4th Generation wRfx: NONREACTIVE

## 2021-04-16 NOTE — Discharge Summary (Signed)
Physician Discharge Summary    Patient ID: Delrae Hagey MRN: [id number] DOB/AGE: 05-Jan-1991  30 y.o.  Patient Care Team: Patient, No Pcp Per (Inactive) as PCP - General (General Practice) Patient, No Pcp Per (Inactive) (General Practice)  Admit date: 04/15/2021  Discharge date: 04/16/2021  Hospital Stay = 0 days    Discharge Diagnoses:  Principal Problem:   Acute cholecystitis Active Problems:   Anaphylactic reaction (rocephin)    1 Day Post-Op  04/15/2021  POST-OPERATIVE DIAGNOSIS:   Cholecystitis  SURGERY:  04/15/2021  Procedure(s): LAPAROSCOPIC CHOLECYSTECTOMY  SURGEON:    Surgeon(s): Diamantina Monks, MD  Consults: anesthesia  Hospital Course:   Patient with history and physical strongly suspicious for cholecystitis.  Was started on IV ceftriaxone in the emergency room and had an anaphylactic or chronic reaction.  This was stopped and controlled with steroids and improved.  Was monitored.  It was felt to safe to proceed with cholecystectomy later in the day.  The patient underwent the surgery above.  Postoperatively, the patient gradually mobilized and advanced to a solid diet.  Pain and other symptoms were treated aggressively.    By the time of discharge, the patient was walking well the hallways, eating food.  Pain was well-controlled on an oral medications.  Based on meeting discharge criteria and continuing to recover, I felt it was safe for the patient to be discharged from the hospital to further recover with close followup. Postoperative recommendations were discussed in detail.  They are written as well.  Work letter given to patient  Discharged Condition: good  Discharge Exam: Blood pressure (!) 98/54, pulse 66, temperature 98.4 F (36.9 C), temperature source Oral, resp. rate 17, height 5\' 5"  (1.651 m), weight 81.6 kg, last menstrual period 04/08/2021, SpO2 100 %.  General: Pt awake/alert/oriented x4 in No acute distress Eyes: PERRL, normal  EOM.  Sclera clear.  No icterus Neuro: CN II-XII intact w/o focal sensory/motor deficits. Lymph: No head/neck/groin lymphadenopathy Psych:  No delerium/psychosis/paranoia HENT: Normocephalic, Mucus membranes moist.  No thrush.  Normal speech without stridor nor conversational dyspnea/weakness.  No hoarseness. Neck: Supple, No tracheal deviation Chest:  No chest wall pain w good excursion CV:  Pulses intact.  Regular rhythm MS: Normal AROM mjr joints.  No obvious deformity Abdomen: Soft.  Nondistended.  Mildly tender at incisions only.  Laparoscopic incisions clean dry and intact.  No evidence of peritonitis.  No incarcerated hernias. Ext:  SCDs BLE.  No mjr edema.  No cyanosis Skin: No petechiae / purpura   Disposition:    Follow-up Information     [facility] Follow up on 05/10/2021.   Specialty: General Surgery Why: 8:30am, please arrive at 8am for paperwork and check in process.  Please bring photo ID and insurance card. Contact information: [address] [place] [phone]                 Discharge disposition: 01-Home or Self Care      Discharge Instructions     Call MD for:   Complete by: As directed    FEVER > 101.5 F  (temperatures < 101.5 F are not significant)   Call MD for:  extreme fatigue   Complete by: As directed    Call MD for:  persistant dizziness or light-headedness   Complete by: As directed    Call MD for:  persistant nausea and vomiting   Complete by: As directed    Call MD for:  redness,  tenderness, or signs of infection (pain, swelling, redness, odor or green/yellow discharge around incision site)   Complete by: As directed    Call MD for:  severe uncontrolled pain   Complete by: As directed    Diet - low sodium heart healthy   Complete by: As directed    Start with a bland diet such as soups, liquids, starchy foods, low fat foods, etc. the first few days at home. Gradually advance to a solid,  low-fat, high fiber diet by the end of the first week at home.   Add a fiber supplement to your diet (Metamucil, etc) If you feel full, bloated, or constipated, stay on a full liquid or pureed/blenderized diet for a few days until you feel better and are no longer constipated.   Discharge instructions   Complete by: As directed    See Discharge Instructions If you are not getting better after two weeks or are noticing you are getting worse, contact our office [phone] for further advice.  We may need to adjust your medications, re-evaluate you in the office, send you to the emergency room, or see what other things we can do to help. The clinic staff is available to answer your questions during regular business hours (8:30am-5pm).  Please don't hesitate to call and ask to speak to one of our nurses for clinical concerns.    A surgeon from [facility] is always on call at the hospitals 24 hours/day If you have a medical emergency, go to the nearest emergency room or call 911.   Discharge wound care:   Complete by: As directed    It is good for closed incisions and even open wounds to be washed every day.  Shower every day.  Short baths are fine.  Wash the incisions and wounds clean with soap & water.    You may leave closed incisions open to air if it is dry.   You may cover the incision with clean gauze & replace it after your daily shower for comfort.  DERMABOND:  You have purple skin glue (Dermabond) on your incision(s).  Leave them in place, and they will fall off on their own like a scab in 2-3 weeks.  You may trim any edges that curl up with clean scissors.   Driving Restrictions   Complete by: As directed    You may drive when: - you are no longer taking narcotic prescription pain medication - you can comfortably wear a seatbelt - you can safely make sudden turns/stops without pain.   Increase activity slowly   Complete by: As directed    Start light daily activities  --- self-care, walking, climbing stairs- beginning the day after surgery.  Gradually increase activities as tolerated.  Control your pain to be active.  Stop when you are tired.  Ideally, walk several times a day, eventually an hour a day.   Most people are back to most day-to-day activities in a few weeks.  It takes 4-6 weeks to get back to unrestricted, intense activity. If you can walk 30 minutes without difficulty, it is safe to try more intense activity such as jogging, treadmill, bicycling, low-impact aerobics, swimming, etc. Save the most intensive and strenuous activity for last (Usually 4-8 weeks after surgery) such as sit-ups, heavy lifting, contact sports, etc.  Refrain from any intense heavy lifting or straining until you are off narcotics for pain control.  You will have off days, but things should improve week-by-week. DO NOT  PUSH THROUGH PAIN.  Let pain be your guide: If it hurts to do something, don't do it.   Lifting restrictions   Complete by: As directed    If you can walk 30 minutes without difficulty, it is safe to try more intense activity such as jogging, treadmill, bicycling, low-impact aerobics, swimming, etc. Save the most intensive and strenuous activity for last (Usually 4-8 weeks after surgery) such as sit-ups, heavy lifting, contact sports, etc.   Refrain from any intense heavy lifting or straining until you are off narcotics for pain control.  You will have off days, but things should improve week-by-week. DO NOT PUSH THROUGH PAIN.  Let pain be your guide: If it hurts to do something, don't do it.  Pain is your body warning you to avoid that activity for another week until the pain goes down.   May shower / Bathe   Complete by: As directed    May walk up steps   Complete by: As directed    Remove dressing in 72 hours   Complete by: As directed    Make sure all dressings are removed by the third day after surgery.  Leave incisions open to air.  OK to cover incisions  with gauze or bandages as desired   Sexual Activity Restrictions   Complete by: As directed    You may have sexual intercourse when it is comfortable. If it hurts to do something, stop.       Allergies as of 04/16/2021       Reactions   Rocephin [ceftriaxone] Anaphylaxis, Shortness Of Breath, Cough        Medication List     STOP taking these medications    traMADol 50 MG tablet Commonly known as: ULTRAM       TAKE these medications    docusate sodium 100 MG capsule Commonly known as: Colace Take 1 capsule (100 mg total) by mouth 2 (two) times daily.   ibuprofen 600 MG tablet Commonly known as: ADVIL Take 1 tablet (600 mg total) by mouth every 6 (six) hours as needed. What changed:  medication strength how much to take reasons to take this   methocarbamol 750 MG tablet Commonly known as: Robaxin-750 Take 1 tablet (750 mg total) by mouth 4 (four) times daily.   ondansetron 4 MG disintegrating tablet Commonly known as: Zofran ODT Take 1 tablet (4 mg total) by mouth every 8 (eight) hours as needed for nausea or vomiting.   oxyCODONE 5 MG immediate release tablet Commonly known as: Roxicodone Take 1 tablet (5 mg total) by mouth every 8 (eight) hours as needed.               Discharge Care Instructions  (From admission, onward)           Start     Ordered   04/16/21 0000  Discharge wound care:       Comments: It is good for closed incisions and even open wounds to be washed every day.  Shower every day.  Short baths are fine.  Wash the incisions and wounds clean with soap & water.    You may leave closed incisions open to air if it is dry.   You may cover the incision with clean gauze & replace it after your daily shower for comfort.  DERMABOND:  You have purple skin glue (Dermabond) on your incision(s).  Leave them in place, and they will fall off on their own like a scab in  2-3 weeks.  You may trim any edges that curl up with clean scissors.    04/16/21 0913            Significant Diagnostic Studies:  Results for orders placed or performed during the hospital encounter of 04/15/21 (from the past 72 hour(s))  Basic metabolic panel     Status: Abnormal   Collection Time: 04/15/21  6:58 AM  Result Value Ref Range   Sodium 135 135 - 145 mmol/L   Potassium 4.1 3.5 - 5.1 mmol/L   Chloride 104 98 - 111 mmol/L   CO2 24 22 - 32 mmol/L   Glucose, Bld 111 (H) 70 - 99 mg/dL    Comment: Glucose reference range applies only to samples taken after fasting for at least 8 hours.   BUN 7 6 - 20 mg/dL   Creatinine, Ser 0.58 0.44 - 1.00 mg/dL   Calcium 9.1 8.9 - 10.3 mg/dL   GFR, Estimated >60 >60 mL/min    Comment: (NOTE) Calculated using the CKD-EPI Creatinine Equation (2021)    Anion gap 7 5 - 15    Comment: Performed at [facility] [address] [place], [place] [address]  CBC     Status: Abnormal   Collection Time: 04/15/21  6:58 AM  Result Value Ref Range   WBC 11.9 (H) 4.0 - 10.5 K/uL   RBC 4.63 3.87 - 5.11 MIL/uL   Hemoglobin 13.7 12.0 - 15.0 g/dL   HCT 42.1 36.0 - 46.0 %   MCV 90.9 80.0 - 100.0 fL   MCH 29.6 26.0 - 34.0 pg   MCHC 32.5 30.0 - 36.0 g/dL   RDW 12.2 11.5 - 15.5 %   Platelets 396 150 - 400 K/uL   nRBC 0.0 0.0 - 0.2 %    Comment: Performed at [facility], [place] [address], [place], [place] [address]  Troponin I (High Sensitivity)     Status: None   Collection Time: 04/15/21  6:58 AM  Result Value Ref Range   Troponin I (High Sensitivity) <2 <18 ng/L    Comment: (NOTE) Elevated high sensitivity troponin I (hsTnI) values and significant  changes across serial measurements may suggest ACS but many other  chronic and acute conditions are known to elevate hsTnI results.  Refer to the "Links" section for chest pain algorithms and additional  guidance. Performed at [facility], [place] [address], [place], [place] [address]   I-Stat beta hCG blood, ED     Status: None   Collection Time:  04/15/21  7:34 AM  Result Value Ref Range   I-stat hCG, quantitative <5.0 <5 mIU/mL   Comment 3            Comment:   GEST. AGE      CONC.  (mIU/mL)   <=1 WEEK        5 - 50     2 WEEKS       50 - 500     3 WEEKS       100 - 10,000     4 WEEKS     1,000 - 30,000        FEMALE AND NON-PREGNANT FEMALE:     LESS THAN 5 mIU/mL   Resp Panel by RT-PCR (Flu A&B, Covid) Nasopharyngeal Swab     Status: None   Collection Time: 04/15/21  8:02 AM   Specimen: Nasopharyngeal Swab; Nasopharyngeal(NP) swabs in vial transport medium  Result Value Ref Range  SARS Coronavirus 2 by RT PCR NEGATIVE NEGATIVE    Comment: (NOTE) SARS-CoV-2 target nucleic acids are NOT DETECTED.  The SARS-CoV-2 RNA is generally detectable in upper respiratory specimens during the acute phase of infection. The lowest concentration of SARS-CoV-2 viral copies this assay can detect is 138 copies/mL. A negative result does not preclude SARS-Cov-2 infection and should not be used as the sole basis for treatment or other patient management decisions. A negative result may occur with  improper specimen collection/handling, submission of specimen other than nasopharyngeal swab, presence of viral mutation(s) within the areas targeted by this assay, and inadequate number of viral copies(<138 copies/mL). A negative result must be combined with clinical observations, patient history, and epidemiological information. The expected result is Negative.  Fact Sheet for Patients:  BloggerCourse.com  Fact Sheet for Healthcare Providers:  SeriousBroker.it  This test is no t yet approved or cleared by the [place] FDA and  has been authorized for detection and/or diagnosis of SARS-CoV-2 by FDA under an Emergency Use Authorization (EUA). This EUA will remain  in effect (meaning this test can be used) for the duration of the COVID-19 declaration under Section 564(b)(1) of the Act,  21 U.S.C.section 360bbb-3(b)(1), unless the authorization is terminated  or revoked sooner.       Influenza A by PCR NEGATIVE NEGATIVE   Influenza B by PCR NEGATIVE NEGATIVE    Comment: (NOTE) The Xpert Xpress SARS-CoV-2/FLU/RSV plus assay is intended as an aid in the diagnosis of influenza from Nasopharyngeal swab specimens and should not be used as a sole basis for treatment. Nasal washings and aspirates are unacceptable for Xpert Xpress SARS-CoV-2/FLU/RSV testing.  Fact Sheet for Patients: BloggerCourse.com  Fact Sheet for Healthcare Providers: SeriousBroker.it  This test is not yet approved or cleared by the [place] FDA and has been authorized for detection and/or diagnosis of SARS-CoV-2 by FDA under an Emergency Use Authorization (EUA). This EUA will remain in effect (meaning this test can be used) for the duration of the COVID-19 declaration under Section 564(b)(1) of the Act, 21 U.S.C. section 360bbb-3(b)(1), unless the authorization is terminated or revoked.  Performed at [facility], [address], [place], [place] [address]   Troponin I (High Sensitivity)     Status: None   Collection Time: 04/15/21  9:15 AM  Result Value Ref Range   Troponin I (High Sensitivity) 4 <18 ng/L    Comment: (NOTE) Elevated high sensitivity troponin I (hsTnI) values and significant  changes across serial measurements may suggest ACS but many other  chronic and acute conditions are known to elevate hsTnI results.  Refer to the "Links" section for chest pain algorithms and additional  guidance. Performed at [facility], [address], [place], [place] [address]   Hepatic function panel     Status: Abnormal   Collection Time: 04/15/21  9:15 AM  Result Value Ref Range   Total Protein 7.6 6.5 - 8.1 g/dL   Albumin 3.8 3.5 - 5.0 g/dL   AST 16 15 - 41 U/L   ALT 17 0 - 44 U/L   Alkaline Phosphatase 62 38 - 126 U/L    Total Bilirubin 0.1 (L) 0.3 - 1.2 mg/dL   Bilirubin, Direct <0.7 0.0 - 0.2 mg/dL   Indirect Bilirubin NOT CALCULATED 0.3 - 0.9 mg/dL    Comment: Performed at [facility], [address] [place], [place] [address]  Lipase, blood     Status: None  Collection Time: 04/15/21  9:15 AM  Result Value Ref Range   Lipase 30 11 - 51 U/L    Comment: Performed at [facility], [place] [address] [place], [place] [address]  Urinalysis, Routine w reflex microscopic Urine, Clean Catch     Status: Abnormal   Collection Time: 04/15/21  9:30 AM  Result Value Ref Range   Color, Urine YELLOW YELLOW   APPearance CLEAR CLEAR   Specific Gravity, Urine 1.015 1.005 - 1.030   pH 6.5 5.0 - 8.0   Glucose, UA NEGATIVE NEGATIVE mg/dL   Hgb urine dipstick TRACE (A) NEGATIVE   Bilirubin Urine NEGATIVE NEGATIVE   Ketones, ur NEGATIVE NEGATIVE mg/dL   Protein, ur NEGATIVE NEGATIVE mg/dL   Nitrite NEGATIVE NEGATIVE   Leukocytes,Ua NEGATIVE NEGATIVE    Comment: Performed at [facility] [address], [place], [place] [address]  Urinalysis, Microscopic (reflex)     Status: Abnormal   Collection Time: 04/15/21  9:30 AM  Result Value Ref Range   RBC / HPF 0-5 0 - 5 RBC/hpf   WBC, UA 0-5 0 - 5 WBC/hpf   Bacteria, UA RARE (A) NONE SEEN   Squamous Epithelial / LPF 0-5 0 - 5   Mucus PRESENT     Comment: Performed at [facility], [place] [address], [place], [place] [address]    DG Chest 2 View  Result Date: 04/15/2021 CLINICAL DATA:  Chest pain under the right breast. EXAM: CHEST - 2 VIEW COMPARISON:  05/29/2018 FINDINGS: The heart size and mediastinal contours are within normal limits. Both lungs are clear. The visualized skeletal structures are unremarkable. IMPRESSION: No active cardiopulmonary disease. Electronically Signed   By: Kathreen Devoid M.D.   On: 04/15/2021 08:16   US Abdomen Limited RUQ (LIVER/GB)  Result Date: 04/15/2021 CLINICAL DATA:  RIGHT upper quadrant pain in a 30 year old  female. EXAM: ULTRASOUND ABDOMEN LIMITED RIGHT UPPER QUADRANT COMPARISON:  CT imaging from 2020. FINDINGS: Gallbladder: Gallstones mixed with sludge in the dependent aspect of the gallbladder. No gallbladder wall thickening. No visible pericholecystic fluid but with reported tenderness over the gallbladder. Common bile duct: Diameter: 5.2 mm Liver: Mildly heterogeneous echotexture. Question mildly nodular liver contour. Portal vein is patent on color Doppler imaging with normal direction of blood flow towards the liver. Other: Mildly limited due to body habitus. IMPRESSION: Cholelithiasis with tenderness over the gallbladder reported at the time of the exam. Findings are suspicious for acute cholecystitis in the appropriate clinical setting but without wall thickening or pericholecystic fluid. HIDA scan could be useful as warranted to add specificity. Question of developing liver disease with mildly nodular hepatic contour. Mild heterogeneity of hepatic echotexture as well. Correlate with any clinical or laboratory evidence of hepatic dysfunction. Electronically Signed   By: Zetta Bills M.D.   On: 04/15/2021 10:02    Past Medical History:  Diagnosis Date   Acute gangrenous appendicitis 05/06/2015   Acute tonsillitis due to infectious mononucleosis 05/29/2018   ADHD (attention deficit hyperactivity disorder)    GERD (gastroesophageal reflux disease)    Pregnant    Scoliosis     Past Surgical History:  Procedure Laterality Date   LAPAROSCOPIC APPENDECTOMY N/A 05/06/2015   Procedure: APPENDECTOMY LAPAROSCOPIC;  Surgeon: Excell Seltzer, MD;  Location: [facility];  Service: General;  Laterality: N/A;   MULTIPLE TOOTH EXTRACTIONS      Social History   Socioeconomic History   Marital status: Married    Spouse name: Not on  file   Number of children: Not on file   Years of education: Not on file   Highest education level: Not on file  Occupational History   Not on file  Tobacco Use   Smoking  status: Former   Smokeless tobacco: Never  Vaping Use   Vaping Use: Every day  Substance and Sexual Activity   Alcohol use: No    Comment: occasional   Drug use: No   Sexual activity: Yes    Birth control/protection: None  Other Topics Concern   Not on file  Social History Narrative   ** Merged History Encounter **       Social Determinants of Health   Financial Resource Strain: Not on file  Food Insecurity: Not on file  Transportation Needs: Not on file  Physical Activity: Not on file  Stress: Not on file  Social Connections: Not on file  Intimate Partner Violence: Not on file    Family History  Adopted: Yes    Current Facility-Administered Medications  Medication Dose Route Frequency Provider Last Rate Last Admin   acetaminophen (TYLENOL) tablet 1,000 mg  1,000 mg Oral Q6H Saverio Danker, PA-C   1,000 mg at 04/16/21 0520   dextrose 5 % and 0.45 % NaCl with KCl 20 mEq/L infusion   Intravenous Continuous Saverio Danker, PA-C 75 mL/hr at 04/15/21 1754 New Bag at 04/15/21 1754   diphenhydrAMINE (BENADRYL) capsule 25 mg  25 mg Oral Q6H PRN Saverio Danker, PA-C       Or   diphenhydrAMINE (BENADRYL) injection 25 mg  25 mg Intravenous Q6H PRN Saverio Danker, PA-C       enoxaparin (LOVENOX) injection 40 mg  40 mg Subcutaneous Q24H Saverio Danker, PA-C       ibuprofen (ADVIL) tablet 600 mg  600 mg Oral Q6H PRN Saverio Danker, PA-C       melatonin tablet 3 mg  3 mg Oral QHS PRN Saverio Danker, PA-C       metoprolol tartrate (LOPRESSOR) injection 5 mg  5 mg Intravenous Q6H PRN Saverio Danker, PA-C       morphine 2 MG/ML injection 1-2 mg  1-2 mg Intravenous Q2H PRN Saverio Danker, PA-C   2 mg at 04/15/21 2340   ondansetron (ZOFRAN) injection 4 mg  4 mg Intravenous Q4H PRN Saverio Danker, PA-C       oxyCODONE (Oxy IR/ROXICODONE) immediate release tablet 5-10 mg  5-10 mg Oral Q4H PRN Saverio Danker, PA-C   10 mg at 04/16/21 0520   simethicone (MYLICON) chewable tablet 40 mg  40 mg  Oral Q6H PRN Saverio Danker, PA-C         Allergies  Allergen Reactions   Rocephin [Ceftriaxone] Anaphylaxis, Shortness Of Breath and Cough    Signed: Morton Peters, MD, FACS, MASCRS Esophageal, Gastrointestinal & Colorectal Surgery Robotic and Minimally Invasive Surgery  [facility], [place]  [id number] [place], [place] [address] [phone] Fax [phone] Main  CONTACT INFORMATION:  Weekday (9AM-5PM): Call [facility] main office at [phone]  Weeknight (5PM-9AM) or Weekend/Holiday: Check [email or web address] (password " [email or web address]") for General Surgery [facility] coverage  (Please, do not use SecureChat as it is not reliable communication to operating surgeons for immediate patient care)      04/16/2021, 9:16 AM

## 2021-04-16 NOTE — TOC Transition Note (Signed)
Transition of Care [facility]) - CM/SW Discharge Note   Patient Details  Name: Makalynn Berwanger MRN: [id number] Date of Birth: August 06, 1990  [facility] CM/SW Contact:  Bess Kinds, RN Phone Number: [phone] 04/16/2021, 9:28 AM   Clinical Narrative:     Patient to transition home today. No PCP. No insurance. Follow up information for [facility] placed on AVS. No other TOC needs identified at this time.   Final next level of care: Home/Self Care Barriers to Discharge: No Barriers Identified   Patient Goals and CMS Choice        Discharge Placement                       Discharge Plan and Services                                     Social Determinants of Health (SDOH) Interventions     Readmission Risk Interventions No flowsheet data found.

## 2021-04-18 ENCOUNTER — Encounter (HOSPITAL_COMMUNITY): Payer: Self-pay | Admitting: Surgery

## 2021-04-18 LAB — SURGICAL PATHOLOGY

## 2021-04-18 NOTE — Anesthesia Postprocedure Evaluation (Signed)
Anesthesia Post Note  Patient: Brandi Hoffman  Procedure(s) Performed: LAPAROSCOPIC CHOLECYSTECTOMY (Abdomen)     Patient location during evaluation: PACU Anesthesia Type: General Level of consciousness: awake and alert Pain management: pain level controlled Vital Signs Assessment: post-procedure vital signs reviewed and stable Respiratory status: spontaneous breathing, nonlabored ventilation, respiratory function stable and patient connected to nasal cannula oxygen Cardiovascular status: blood pressure returned to baseline and stable Postop Assessment: no apparent nausea or vomiting Anesthetic complications: no   No notable events documented.  Last Vitals:  Vitals:   04/16/21 0522 04/16/21 0732  BP: (!) 104/41 (!) 98/54  Pulse: 76 66  Resp:  17  Temp: 36.7 C 36.9 C  SpO2: 100% 100%    Last Pain:  Vitals:   04/16/21 0900  TempSrc:   PainSc: 7                  Natalia Wittmeyer

## 2021-04-25 ENCOUNTER — Emergency Department (HOSPITAL_BASED_OUTPATIENT_CLINIC_OR_DEPARTMENT_OTHER)
Admission: EM | Admit: 2021-04-25 | Discharge: 2021-04-25 | Disposition: A | Discharge status: Home / Self Care | Payer: Medicaid Other | Attending: Emergency Medicine | Admitting: Emergency Medicine

## 2021-04-25 ENCOUNTER — Encounter (HOSPITAL_BASED_OUTPATIENT_CLINIC_OR_DEPARTMENT_OTHER): Payer: Self-pay | Admitting: Emergency Medicine

## 2021-04-25 ENCOUNTER — Other Ambulatory Visit: Payer: Self-pay

## 2021-04-25 ENCOUNTER — Emergency Department (HOSPITAL_BASED_OUTPATIENT_CLINIC_OR_DEPARTMENT_OTHER): Payer: Medicaid Other

## 2021-04-25 DIAGNOSIS — Z87891 Personal history of nicotine dependence: Secondary | ICD-10-CM | POA: Insufficient documentation

## 2021-04-25 DIAGNOSIS — L231 Allergic contact dermatitis due to adhesives: Secondary | ICD-10-CM

## 2021-04-25 DIAGNOSIS — R109 Unspecified abdominal pain: Secondary | ICD-10-CM | POA: Insufficient documentation

## 2021-04-25 LAB — COMPREHENSIVE METABOLIC PANEL
ALT: 10 U/L (ref 0–44)
AST: 11 U/L — ABNORMAL LOW (ref 15–41)
Albumin: 4.3 g/dL (ref 3.5–5.0)
Alkaline Phosphatase: 61 U/L (ref 38–126)
Anion gap: 9 (ref 5–15)
BUN: 8 mg/dL (ref 6–20)
CO2: 24 mmol/L (ref 22–32)
Calcium: 9.2 mg/dL (ref 8.9–10.3)
Chloride: 107 mmol/L (ref 98–111)
Creatinine, Ser: 0.57 mg/dL (ref 0.44–1.00)
GFR, Estimated: >60 mL/min (ref >60)
Glucose, Bld: 100 mg/dL — ABNORMAL HIGH (ref 70–99)
Potassium: 3.8 mmol/L (ref 3.5–5.1)
Sodium: 140 mmol/L (ref 135–145)
Total Bilirubin: 0.3 mg/dL (ref 0.3–1.2)
Total Protein: 7.5 g/dL (ref 6.5–8.1)

## 2021-04-25 LAB — CBC WITH DIFFERENTIAL/PLATELET
Abs Immature Granulocytes: 0.06 10*3/uL (ref 0.00–0.07)
Basophils Absolute: 0.1 10*3/uL (ref 0.0–0.1)
Basophils Relative: 0 %
Eosinophils Absolute: 0.7 10*3/uL — ABNORMAL HIGH (ref 0.0–0.5)
Eosinophils Relative: 4 %
HCT: 41.2 % (ref 36.0–46.0)
Hemoglobin: 13.8 g/dL (ref 12.0–15.0)
Immature Granulocytes: 0 %
Lymphocytes Relative: 14 %
Lymphs Abs: 2.4 10*3/uL (ref 0.7–4.0)
MCH: 29.5 pg (ref 26.0–34.0)
MCHC: 33.5 g/dL (ref 30.0–36.0)
MCV: 88 fL (ref 80.0–100.0)
Monocytes Absolute: 0.9 10*3/uL (ref 0.1–1.0)
Monocytes Relative: 5 %
Neutro Abs: 12.8 10*3/uL — ABNORMAL HIGH (ref 1.7–7.7)
Neutrophils Relative %: 77 %
Platelets: 454 10*3/uL — ABNORMAL HIGH (ref 150–400)
RBC: 4.68 MIL/uL (ref 3.87–5.11)
RDW: 12.7 % (ref 11.5–15.5)
WBC: 16.9 10*3/uL — ABNORMAL HIGH (ref 4.0–10.5)
nRBC: 0 % (ref 0.0–0.2)

## 2021-04-25 LAB — LACTIC ACID, PLASMA: Lactic Acid, Venous: 0.7 mmol/L (ref 0.5–1.9)

## 2021-04-25 MED ORDER — IOHEXOL 300 MG/ML  SOLN
100.0000 mL | Freq: Once | INTRAMUSCULAR | Status: AC | PRN
  Administered 2021-04-25: 22:00:00 100 mL via INTRAVENOUS

## 2021-04-25 MED ORDER — DOXYCYCLINE HYCLATE 100 MG PO TABS
100.0000 mg | ORAL_TABLET | Freq: Once | ORAL | Status: AC
  Administered 2021-04-25: 23:00:00 100 mg via ORAL
  Filled 2021-04-25: qty 1

## 2021-04-25 MED ORDER — PREDNISONE 20 MG PO TABS: ORAL_TABLET | ORAL | 0 refills | Status: AC

## 2021-04-25 MED ORDER — DOXYCYCLINE HYCLATE 100 MG PO CAPS: 100.0000 mg | ORAL_CAPSULE | Freq: Two times a day (BID) | ORAL | 0 refills | Status: AC

## 2021-04-25 MED ORDER — PREDNISONE 50 MG PO TABS
60.0000 mg | ORAL_TABLET | Freq: Once | ORAL | Status: AC
  Administered 2021-04-25: 23:00:00 60 mg via ORAL
  Filled 2021-04-25: qty 1

## 2021-04-25 NOTE — ED Notes (Signed)
Pt verbalizes understanding of discharge instructions. Opportunity for questioning and answers were provided. Pt discharged from ED to home.   ? ?

## 2021-04-25 NOTE — ED Provider Notes (Signed)
[facility] EMERGENCY DEPT Provider Note   CSN: [id number] Arrival date & time: 04/25/21  1858     History Chief Complaint  Patient presents with   Post-op Problem    Brandi Hoffman is a 30 y.o. female.  30 yo F with a chief complaints of an itchy rash to her incision sites.  This been going on since she had laparoscopic cholecystectomy done.  She feels like the rash is gotten worse.  Was experiencing some fevers at home.  No nausea or vomiting.  She also seem to have an allergy to the skin adhesive and was noted at different spots throughout her arms.  She denies any significant abdominal discomfort.  Denies any urinary symptoms.  Denies cough congestion or fever.  At the onset of this she was seen by her surgeon and thought to be an allergic reaction to her skin adhesive.  Was told to take Benadryl and use topical creams.  The history is provided by the patient.  Illness Severity:  Moderate Onset quality:  Gradual Duration:  2 days Timing:  Constant Progression:  Worsening Chronicity:  New Associated symptoms: fever and rash   Associated symptoms: no chest pain, no congestion, no headaches, no myalgias, no nausea, no rhinorrhea, no shortness of breath, no vomiting and no wheezing       Past Medical History:  Diagnosis Date   Acute gangrenous appendicitis 05/06/2015   Acute tonsillitis due to infectious mononucleosis 05/29/2018   ADHD (attention deficit hyperactivity disorder)    GERD (gastroesophageal reflux disease)    Pregnant    Scoliosis     Patient Active Problem List   Diagnosis Date Noted   Anaphylactic reaction (rocephin)  04/16/2021   Acute cholecystitis 04/15/2021   NSVD (normal spontaneous vaginal delivery) 05/08/2013   Normal pregnancy 05/07/2013    Past Surgical History:  Procedure Laterality Date   CHOLECYSTECTOMY N/A 04/15/2021   Procedure: LAPAROSCOPIC CHOLECYSTECTOMY;  Surgeon: Jesusita Oka, MD;  Location: [facility];  Service:  General;  Laterality: N/A;   LAPAROSCOPIC APPENDECTOMY N/A 05/06/2015   Procedure: APPENDECTOMY LAPAROSCOPIC;  Surgeon: Excell Seltzer, MD;  Location: [facility];  Service: General;  Laterality: N/A;   MULTIPLE TOOTH EXTRACTIONS       OB History     Gravida  2   Para  2   Term  2   Preterm      AB      Living  2      SAB      IAB      Ectopic      Multiple      Live Births  2           Family History  Adopted: Yes    Social History   Tobacco Use   Smoking status: Former   Smokeless tobacco: Never  Scientific laboratory technician Use: Every day  Substance Use Topics   Alcohol use: No    Comment: occasional   Drug use: No    Home Medications Prior to Admission medications   Medication Sig Start Date End Date Taking? Authorizing Provider  doxycycline (VIBRAMYCIN) 100 MG capsule Take 1 capsule (100 mg total) by mouth 2 (two) times daily. One po bid x 7 days 04/25/21  Yes Deno Etienne, DO  predniSONE (DELTASONE) 20 MG tablet 2 tabs po daily x 4 days 04/25/21  Yes Deno Etienne, DO  docusate sodium (COLACE) 100 MG capsule Take 1 capsule (100 mg total) by mouth 2 (  two) times daily. 04/15/21 07/14/21  Jesusita Oka, MD  ibuprofen (ADVIL) 600 MG tablet Take 1 tablet (600 mg total) by mouth every 6 (six) hours as needed. 04/15/21   Jesusita Oka, MD  methocarbamol (ROBAXIN-750) 750 MG tablet Take 1 tablet (750 mg total) by mouth 4 (four) times daily. 04/15/21   Jesusita Oka, MD  ondansetron (ZOFRAN ODT) 4 MG disintegrating tablet Take 1 tablet (4 mg total) by mouth every 8 (eight) hours as needed for nausea or vomiting. 03/21/21   Volney American, PA-C  oxyCODONE (ROXICODONE) 5 MG immediate release tablet Take 1 tablet (5 mg total) by mouth every 8 (eight) hours as needed. 04/15/21   Jesusita Oka, MD    Allergies    Rocephin [ceftriaxone] and Robaxin [methocarbamol]  Review of Systems   Review of Systems  Constitutional:  Positive for fever. Negative for  chills.  HENT:  Negative for congestion and rhinorrhea.   Eyes:  Negative for redness and visual disturbance.  Respiratory:  Negative for shortness of breath and wheezing.   Cardiovascular:  Negative for chest pain and palpitations.  Gastrointestinal:  Negative for nausea and vomiting.  Genitourinary:  Negative for dysuria and urgency.  Musculoskeletal:  Negative for arthralgias and myalgias.  Skin:  Positive for rash. Negative for pallor and wound.  Neurological:  Negative for dizziness and headaches.   Physical Exam Updated Vital Signs BP (!) 108/92    Pulse 69    Temp 98.8 F (37.1 C) (Oral)    Resp 18    Ht 5\' 5"  (1.651 m)    Wt 77.1 kg    LMP 04/25/2021 (Approximate)    SpO2 95%    BMI 28.29 kg/m   Physical Exam Vitals and nursing note reviewed.  Constitutional:      General: She is not in acute distress.    Appearance: She is well-developed. She is not diaphoretic.  HENT:     Head: Normocephalic and atraumatic.  Eyes:     Pupils: Pupils are equal, round, and reactive to light.  Cardiovascular:     Rate and Rhythm: Normal rate and regular rhythm.     Heart sounds: No murmur heard.   No friction rub. No gallop.  Pulmonary:     Effort: Pulmonary effort is normal.     Breath sounds: No wheezing or rales.  Abdominal:     General: There is no distension.     Palpations: Abdomen is soft.     Tenderness: There is no abdominal tenderness.     Comments: At each of the skin incisions there is surrounding erythema with palpable skin changes.  Some honey colored crusting.  Serous oozing mostly at the wound at the umbilicus.  The wounds appear to be intact its the surrounding skin that is affected.  She has a rash to the [facility] of the right arm.  Musculoskeletal:        General: No tenderness.     Cervical back: Normal range of motion and neck supple.  Skin:    General: Skin is warm and dry.  Neurological:     Mental Status: She is alert and oriented to person, place, and time.   Psychiatric:        Behavior: Behavior normal.    ED Results / Procedures / Treatments   Labs (all labs ordered are listed, but only abnormal results are displayed) Labs Reviewed  COMPREHENSIVE METABOLIC PANEL - Abnormal; Notable for the following components:  Result Value   Glucose, Bld 100 (*)    AST 11 (*)    All other components within normal limits  CBC WITH DIFFERENTIAL/PLATELET - Abnormal; Notable for the following components:   WBC 16.9 (*)    Platelets 454 (*)    Neutro Abs 12.8 (*)    Eosinophils Absolute 0.7 (*)    All other components within normal limits  LACTIC ACID, PLASMA  URINALYSIS, ROUTINE W REFLEX MICROSCOPIC  PREGNANCY, URINE    EKG None  Radiology CT ABDOMEN PELVIS W CONTRAST  Result Date: 04/25/2021 CLINICAL DATA:  Abdominal pain, post-op cholecystectomy last Friday and now has drainage from the incision sites, "heat coming off it" and rash. Reported tmax at home 101. Scanned thru surgical incision. EXAM: CT ABDOMEN AND PELVIS WITH CONTRAST TECHNIQUE: Multidetector CT imaging of the abdomen and pelvis was performed using the standard protocol following bolus administration of intravenous contrast. CONTRAST:  167mL OMNIPAQUE IOHEXOL 300 MG/ML  SOLN COMPARISON:  None. FINDINGS: Lower chest: No acute abnormality. Hepatobiliary: No focal liver abnormality. Status post cholecystectomy. Mild hypodensity along the gallbladder fossa likely postsurgical changes. No biliary dilatation. Pancreas: No focal lesion. Normal pancreatic contour. No surrounding inflammatory changes. No main pancreatic ductal dilatation. Spleen: Normal in size without focal abnormality. Adrenals/Urinary Tract: No adrenal nodule bilaterally. Bilateral kidneys enhance symmetrically. No hydronephrosis. No hydroureter. The urinary bladder is unremarkable. On delayed imaging, there is no urothelial wall thickening and there are no filling defects in the opacified portions of the bilateral  collecting systems or ureters. Stomach/Bowel: Stomach is within normal limits. No evidence of bowel wall thickening or dilatation. Status post appendectomy. Vascular/Lymphatic: No abdominal aorta or iliac aneurysm. No abdominal, pelvic, or inguinal lymphadenopathy. Reproductive: Uterus and bilateral adnexa are unremarkable. Other: No intraperitoneal free fluid. No intraperitoneal free gas. No organized fluid collection. Musculoskeletal: No abdominal wall hernia or abnormality. Mild subcutaneus soft tissue edema along the right anterolateral abdominal wall and umbilical region likely due to surgical laparoscopic incisions. No organized fluid collection or abscess formation. No suspicious lytic or blastic osseous lesions. No acute displaced fracture. IMPRESSION: No acute intra-abdominal or intrapelvic abnormality in a patient status post cholecystectomy. Electronically Signed   By: Iven Finn M.D.   On: 04/25/2021 22:40    Procedures Procedures   Medications Ordered in ED Medications  doxycycline (VIBRA-TABS) tablet 100 mg (has no administration in time range)  predniSONE (DELTASONE) tablet 60 mg (has no administration in time range)  iohexol (OMNIPAQUE) 300 MG/ML solution 100 mL (100 mLs Intravenous Contrast Given 04/25/21 2218)    ED Course  I have reviewed the triage vital signs and the nursing notes.  Pertinent labs & imaging results that were available during my care of the patient were reviewed by me and considered in my medical decision making (see chart for details).    MDM Rules/Calculators/A&P                         30 yo F with a chief complaints of a rash after having a laparoscopic cholecystectomy.  Rash looks like contact dermatitis clinically.  Does not explain her fever of 101 at home.  She also has a leukocytosis here.  CT scan of the abdomen pelvis without intra-abdominal pathology.  No significant abdominal pain on my exam.  I suspect this continues to be most likely an  allergic reaction.  With her leukocytosis and fevers at home we will start her on antibiotics.  Burst of  steroids for the likely contact dermatitis.  General surgery follow-up.  11:23 PM:  I have discussed the diagnosis/risks/treatment options with the patient and believe the pt to be eligible for discharge home to follow-up with Gen surgery. We also discussed returning to the ED immediately if new or worsening sx occur. We discussed the sx which are most concerning (e.g., sudden worsening pain, fever, inability to tolerate by mouth) that necessitate immediate return. Medications administered to the patient during their visit and any new prescriptions provided to the patient are listed below.  Medications given during this visit Medications  doxycycline (VIBRA-TABS) tablet 100 mg (has no administration in time range)  predniSONE (DELTASONE) tablet 60 mg (has no administration in time range)  iohexol (OMNIPAQUE) 300 MG/ML solution 100 mL (100 mLs Intravenous Contrast Given 04/25/21 2218)     The patient appears reasonably screen and/or stabilized for discharge and I doubt any other medical condition or other [facility] requiring further screening, evaluation, or treatment in the ED at this time prior to discharge.      Final Clinical Impression(s) / ED Diagnoses Final diagnoses:  Allergic contact dermatitis due to adhesives    Rx / DC Orders ED Discharge Orders          Ordered    predniSONE (DELTASONE) 20 MG tablet        04/25/21 2318    doxycycline (VIBRAMYCIN) 100 MG capsule  2 times daily        04/25/21 2318             Melene Plan, DO 04/25/21 2323

## 2021-04-25 NOTE — ED Triage Notes (Signed)
Pt states she had a cholecystectomy last Friday and now has drainage from the incision sites, "heat coming off it" and rash. Reported tmax at home 101.

## 2021-04-25 NOTE — Discharge Instructions (Signed)
Please return for worsening symptoms, abdominal pain, inability to eat or drink.

## 2021-04-25 NOTE — ED Notes (Signed)
Second lactic canceled per EDP.

## 2021-04-25 NOTE — ED Notes (Signed)
Patient will have her CT scan when she is in a room or in  the side waiting room inside the ED.  Per policy we can't give contrast to a waiting room patient.

## 2021-05-04 ENCOUNTER — Encounter: Payer: Medicaid Other | Admitting: Obstetrics & Gynecology
